# Patient Record
Sex: Female | Born: 1950 | Race: White | Hispanic: No | Marital: Married | Smoking: Former smoker
Health system: Southern US, Community
[De-identification: ages and names within clinical notes are randomized; demographics above are authoritative.]

## PROBLEM LIST (undated history)

## (undated) DIAGNOSIS — D649 Anemia, unspecified: Secondary | ICD-10-CM

## (undated) DIAGNOSIS — I10 Essential (primary) hypertension: Secondary | ICD-10-CM

## (undated) DIAGNOSIS — E119 Type 2 diabetes mellitus without complications: Secondary | ICD-10-CM

## (undated) DIAGNOSIS — E7800 Pure hypercholesterolemia, unspecified: Secondary | ICD-10-CM

## (undated) HISTORY — PX: FRACTURE SURGERY: SHX138

---

## 2001-09-03 ENCOUNTER — Ambulatory Visit (HOSPITAL_COMMUNITY): Admission: RE | Admit: 2001-09-03 | Discharge: 2001-09-03 | Payer: Self-pay | Admitting: Family Medicine

## 2001-09-03 ENCOUNTER — Encounter: Payer: Self-pay | Admitting: Family Medicine

## 2003-09-08 ENCOUNTER — Encounter: Admission: RE | Admit: 2003-09-08 | Discharge: 2003-12-07 | Payer: Self-pay | Admitting: Family Medicine

## 2003-09-14 ENCOUNTER — Other Ambulatory Visit: Admission: RE | Admit: 2003-09-14 | Discharge: 2003-09-14 | Payer: Self-pay | Admitting: Family Medicine

## 2006-05-02 ENCOUNTER — Inpatient Hospital Stay (HOSPITAL_BASED_OUTPATIENT_CLINIC_OR_DEPARTMENT_OTHER): Admission: RE | Admit: 2006-05-02 | Discharge: 2006-05-02 | Payer: Self-pay | Admitting: Interventional Cardiology

## 2006-05-28 ENCOUNTER — Encounter (INDEPENDENT_AMBULATORY_CARE_PROVIDER_SITE_OTHER): Payer: Self-pay | Admitting: Specialist

## 2006-05-28 ENCOUNTER — Ambulatory Visit (HOSPITAL_COMMUNITY): Admission: RE | Admit: 2006-05-28 | Discharge: 2006-05-28 | Payer: Self-pay | Admitting: Gastroenterology

## 2006-06-14 ENCOUNTER — Inpatient Hospital Stay (HOSPITAL_COMMUNITY): Admission: EM | Admit: 2006-06-14 | Discharge: 2006-06-20 | Payer: Self-pay | Admitting: *Deleted

## 2006-09-18 ENCOUNTER — Other Ambulatory Visit: Admission: RE | Admit: 2006-09-18 | Discharge: 2006-09-18 | Payer: Self-pay | Admitting: Family Medicine

## 2008-09-09 ENCOUNTER — Other Ambulatory Visit: Admission: RE | Admit: 2008-09-09 | Discharge: 2008-09-09 | Payer: Self-pay | Admitting: Family Medicine

## 2010-08-12 NOTE — H&P (Signed)
NAME:  Alyssa Stevens, Alyssa Stevens             ACCOUNT NO.:  [id number]   MEDICAL RECORD NO.:  [id number]          PATIENT TYPE:  INP   LOCATION:  1825                         FACILITY:  [facility]   PHYSICIAN:  Lucita Ferrara, MD         DATE OF BIRTH:  1950/10/22   DATE OF ADMISSION:  06/14/2006  DATE OF DISCHARGE:                              HISTORY & PHYSICAL   PRIMARY CARE PHYSICIAN:  Stacie Acres. Cliffton Asters, M.D.   The patient is a 60 year old female who presented to [facility] with a GI bleed.  She was sent by the primary care doctor, Dr.  Laurann Montana, from the office today.  The patient had symptoms of chest  pain, shortness of breath, similar symptoms that she initially had back  in January, at which time Dr. Cliffton Asters sent her for a cardiac evaluation  and Dr. Eldridge Dace did a cardiac catheterization with results showing  negative coronary artery disease.  The patient then showed significant  loss in her hemoglobin from 8.6 on April 08, 2006, to some improvement  with iron in February to 9.2.  The patient currently states that she has  bright red blood per rectum.  Rectal exam in the office showed no  hemorrhoidal bleeding.   The patient had a colonoscopy and endoscopy on May 28, 2006, by Dr.  Ewing Schlein.  The colonoscopy showed internal/externa hemorrhoid which was  thought to cause the bleeding and also sigmoid hyperplastic polyp with a  cold biopsy.  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  The patient was asymptomatic  until 3 days prior, where she again started having shortness of breath  and chest tightness.  Again, she had recurrent blood in the stools.  The  patient states she also has black stools at times, yet she attributed  that to intake of iron.   Otherwise review of systems is negative.  She denies any focal  neurological deficits.   PAST MEDICAL HISTORY:  1. Diabetes, now for 3 years.  2. Hypertension.   MEDICATIONS:  1. Metformin 2,000 mg q.h.s.  2. Lisinopril/hydrochlorothiazide, dose unknown.   ALLERGIES:  NO KNOWN DRUG ALLERGIES.   PAST SURGICAL HISTORY:  As above.   SOCIAL HISTORY:  She lives with her husband.  She denies any smoking or  drugs or alcohol.   PHYSICAL EXAMINATION:  GENERAL:  The patient is in no acute distress.  VITAL SIGNS:  Blood pressure when I saw her was 157/60, heart rate 80,  respirations 16.  HEENT:  Normocephalic atraumatic.  Mucous membranes are moist.  Sclerae  anicteric.  No JVD.  No carotid bruits.  NECK:  Supple.  CARDIOVASCULAR:  S1 S2.  Regular rate and rhythm.  No murmurs, rubs,  clicks.  ABDOMEN:  Soft, nontender, nondistended.  Positive bowel sounds.  RECTAL:  no hemaroid. stool black, quiac positive.  EXTREMITIES:  No cyanosis, clubbing, or edema.  NEUROLOGIC:  Alert and oriented x3.  Cranial nerves II-XII grossly  intact.   LABORATORY DATA:  Basic metabolic panel shows mildly low potassium at  3.3, otherwise normal.  Coags within normal limits.  Hepatic function  within normal limits.  Hemoglobin 5.7, hematocrit 17.6, and MCV of 68.7.   ASSESSMENT/PLAN:  1. This is a 60 year old female with lower gastrointestinal bleeding      most likely secondary to arteriovenous malformation.  Note, I spoke      to Dr. Laural Benes from the gastroenterology service and partner with      Dr. Ewing Schlein, who suggested the patient should be stabilized tonight      with transfusion of packed red blood cells to bring hemoglobin up.      The patient currently hemodynamically stable. If blood pressure      drops we will go ahead and give IV boluses until the blood pressure      is normotensive.  The patient will be admitted to the stepdown unit      and monitored hemodynamically.  In discussion with Dr. Laural Benes, Dr.      Laural Benes suggested that colonoscopy and procedures would be utilized      in order to investigate cause of GI bleeding that may most likely      be an AV  malformation and the malformation will be repaired.  2. Diabetes on Metformin.  We will go ahead and stop Metformin for      tonight and do sliding scale coverage.  3. I have explained plans and procedures of this admission and      hospital coarse to the patient and she understands.      Lucita Ferrara, MD  Electronically Signed     RR/MEDQ  D:  06/14/2006  T:  06/14/2006  Job:  [id number]   cc:   Stacie Acres. Cliffton Asters, M.D.

## 2010-08-12 NOTE — Op Note (Signed)
NAME:  Alyssa Stevens, Alyssa Stevens             ACCOUNT NO.:  [id number]   MEDICAL RECORD NO.:  [id number]          PATIENT TYPE:  INP   LOCATION:  5006                         FACILITY:  [facility]   PHYSICIAN:  Lear Ng, MDDATE OF BIRTH:  1950-11-16   DATE OF PROCEDURE:  DATE OF DISCHARGE:                               OPERATIVE REPORT   PROCEDURE:  Colonoscopy.   INDICATIONS FOR PROCEDURE:  Obscured GI bleeding.   MEDICATIONS:  See preceding EGD report (a total of 125 mcg fentanyl and  12.5 mg IV Versed given for both procedures.   FINDINGS:  Rectal exam was normal.  An adult colonoscope was inserted  into a well-prepped colon and advanced to the cecum where the ileocecal  valve and appendiceal orifice were identified.  In order to reach the  cecum, the patient had to be placed in a supine position and abdominal  pressure had to be applied.  Upon immediately entering the cecum, there  was a small adherent clot attached to one wall of the cecum where an AVM  was noted to be surrounding this clot.  This clot was easily washed  away, and there was small oozing of blood from the middle of this AVM.  No other bleeding source was identified.  The terminal ileum was  intubated and was normal in appearance.  No blood products were seen in  the terminal ileum.  In the cecum, APC was completed at this bleeding  AVM at settings of 240 using the Erbe.  A small amount of oozing  persisted despite argon plasma coagulation.  A total of three hemoclips  were then attached to the cecal wall at the site of the AVM, and a  trickle of blood still remained.  Despite repeated irrigation, this  small oozing of blood persisted.  At this point, 5 mL of 1:10,000  epinephrine was injected in and around the AVM, with complete  hemostasis.  On careful withdrawal of the colonoscope, no other bleeding  source was identified.   ASSESSMENT:  Bleeding cecal arteriovenous malformation, status post  argon plasma  coagulation, three Hemoclips, and epinephrine injection of  a total 5 mL with complete hemostasis.   PLAN:  1. Avoid aspirin if possible  2. Follow serial hemoglobins and hematocrits.  3. If bleeding recurs, then consider repeat colonoscopy versus      embolization.      Lear Ng, MD  Electronically Signed     VCS/MEDQ  D:  06/18/2006  T:  06/18/2006  Job:  [id number]

## 2010-08-12 NOTE — Discharge Summary (Signed)
NAME:  Alyssa Stevens, Alyssa Stevens             ACCOUNT NO.:  [id number]   MEDICAL RECORD NO.:  [id number]          PATIENT TYPE:  INP   LOCATION:  5006                         FACILITY:  [facility]   PHYSICIAN:  Andres Shad. Rudean Curt, MD     DATE OF BIRTH:  [birth year]   DATE OF ADMISSION:  06/14/2006  DATE OF DISCHARGE:  06/20/2006                               DISCHARGE SUMMARY   DISCHARGE DIAGNOSES:  1. Lower gastrointestinal bleed due to arteriovenous malformation.  2. Anemia due to acute gastrointestinal blood loss.   CONDITION ON DISCHARGE:  Good.   SUMMARY OF HOSPITALIZATION:  [name] is a 60 year old female with a  history of diabetes and hypertension who was admitted to the hospital on  March 20 with bright red blood per rectum and a hemoglobin of 5.7.  She  had recently been discharged from the hospital on May 28, 2006 after  having had a lower GI bleed.  She was found to have internal and  external hemorrhoids, a sigmoid polyp, and a questionable cecal AVM on a  colonoscopy performed March 3.  During this admission, the patient was  clinically stable on admission.  She received three units of transfused  blood, and Gastroenterology was consulted.  Several diagnostic  interventions were performed.  These included CT enterography and a  Meckel scan which were negative.  The patient also had a colonoscopy and  upper endoscopy, and these were significant for active bleeding from the  AVM and her cecum.  This was managed with clips and with epinephrine  injections, and the patient remained stable without any further episodes  of bleeding thereafter.   She was discharged to home in stable condition on June 20, 2006.  She  has been instructed to follow up with Gastroenterology as an outpatient.      Andres Shad. Rudean Curt, MD  Electronically Signed     PML/MEDQ  D:  06/20/2006  T:  06/20/2006  Job:  [id number]   cc:   Stacie Acres. Cliffton Asters, M.D.  Graylin Shiver, M.D.

## 2010-08-12 NOTE — Consult Note (Signed)
NAME:  Alyssa Stevens, Alyssa Stevens NO.:  [id number]   MEDICAL RECORD NO.:  [id number]          PATIENT TYPE:  INP   LOCATION:  [id number]                         FACILITY:  [facility]   PHYSICIAN:  Wonda Horner, M.D.   DATE OF BIRTH:  [birth year]   DATE OF CONSULTATION:  06/15/2006  DATE OF DISCHARGE:                                 CONSULTATION   REASON FOR CONSULTATION:  The patient is a 60 year old female who was  admitted to the hospital yesterday with lower GI bleeding.  The patient  was experiencing rectal bleeding associated with loose diarrheal stools  which started several days ago and she dropped her hemoglobin.  She was,  therefore, admitted to the hospital.  The patient has been experiencing  intermittent bouts of loose stools associated with rectal bleeding since  January of this year. She underwent an EGD and a colonoscopy earlier  this month by Dr. Lizbeth Bark without findings of any definite lesions to  explain the rectal bleeding.  The EGD did show a small hiatal hernia and  there was a small area in the esophagus that was biopsied that showed  some fungal elements and she was treated with what sounds Nystatin swish  and swallow. She is not experiencing any dysphagia.  She does not vomit  any blood.  Her colonoscopy showed a few rectal and sigmoid hyperplastic  appearing polyps. There was also mention of a questionable cecal AVM  versus scope trauma, but no evidence of active bleeding.   PAST HISTORY:  Diabetes, hypertension.   MEDICATIONS:  Metformin, lisinopril.   ALLERGIES:  None known.   SOCIAL HISTORY:  She does not smoke or drink alcohol.   REVIEW OF SYSTEMS:  No chest pain or shortness of breath at this time.  She did have some episodes of chest pain recently. She has had a cardiac  clearance in the recent past.   PHYSICAL EXAMINATION:  VITAL SIGNS:  Stable.  GENERAL:  She is in no distress.  HEENT:  Nonicteric.  HEART:  Regular rhythm.  No murmurs.  LUNGS:  Clear.  ABDOMEN:  Soft, nontender, no hepatosplenomegaly   Hemoglobin on admission was 5.7.  She has been transfused 3 units and  hemoglobin is 7.7 now, hematocrit is 23.3.  Protime 13.4.   IMPRESSION:  Lower gastrointestinal bleeding.   COMMENTS:  This has been a recurrent problem since January of this year  with loose stools associated with red rectal bleeding. She essentially  has no pain associated with this problem.  She has had a recent EGD and  colonoscopy by Dr. Watt Climes which were unrevealing for a specific source of  bleeding.  There was the question of a cecal AVM but it could have been  scope trauma, also, per Dr. Perley Jain report.   PLAN:  I am going to look for other possible causes of bleeding.  One  thought that comes to mind could be a Meckel's diverticulum. I will,  therefore, order a Meckel's scan. If this is negative, I would then  consider CT enterography looking for a small bowel lesion.  I would do  these looking for other possibilities of bleeding before repeating  colonoscopy which was just done a little over two weeks ago.           ______________________________  Wonda Horner, M.D.     SFG/MEDQ  D:  06/15/2006  T:  06/15/2006  Job:  [id number]   cc:   Emeline General. Dema Severin, M.D.  Jeryl Columbia, M.D.  Girard Cooter, MD

## 2010-08-12 NOTE — Cardiovascular Report (Signed)
NAME:  Alyssa Stevens, Alyssa Stevens             ACCOUNT NO.:  [id number]   MEDICAL RECORD NO.:  [id number]          PATIENT TYPE:  OIB   LOCATION:  1963                         FACILITY:  [facility]   PHYSICIAN:  Corky Crafts, MDDATE OF BIRTH:  Aug 12, 1950   DATE OF PROCEDURE:  05/02/2006  DATE OF DISCHARGE:                            CARDIAC CATHETERIZATION   REFERRING PHYSICIAN:  Stacie Acres. Cliffton Asters, M.D.   PROCEDURES PERFORMED:  Left heart catheterization, left ventriculogram,  coronary angiogram, abdominal aortogram.   INDICATIONS:  Abnormal stress test and chest pain, diabetes,  hypertension.   PROCEDURE NARRATIVE:  The risks and benefits of cardiac catheterization  were explained to the patient and informed consent was obtained.  The  patient was brought to the catheterization lab and placed on the table.  She was prepped and draped in the usual sterile fashion.  The right  groin was infiltrated with 1% lidocaine.  A 4-French arterial sheath was  placed into her right femoral artery using modified Seldinger technique.  Left coronary artery angiography was performed using a JL-4.0 catheter.  The catheter was advanced to the vessel ostium under fluoroscopic  guidance.  Digital angiography was performed in multiple projections  using hand injection of contrast.  Right coronary artery angiography was  then performed using a 3-D RC catheter.  The catheter was advanced to  the vessel ostium under fluoroscopic guidance.  Digital angiography was  performed in multiple projections using hand injection of contrast.  The  left ventriculogram was then performed using a pigtail catheter.  The  pigtail was advanced to the ascending aorta and across the aortic valve.  Power injection of contrast was done in a 30-degree RAO position.  The  catheter was then pulled back under continuous hemodynamic pressure  monitoring.  The catheter then pulled back to the level of the abdominal  aorta.  A power  injection of contrast was done in the AP projection to  visualize the renal arteries.  The sheath was removed using manual  compression.   FINDINGS:  The left main is short vessel and is widely patent.  There is  a large ramus vessel with mild luminal irregularities.  The LAD is a  large vessel with mild irregularities.  It is large proximally.  The  distal portion in the vessel is small with mild irregularities.  There  is a small D-1 and D-2 which branch off the LAD.  The circumflex is a  large vessel, gives rise to a small OM-1.  There is a large OM-2.  There  are luminal irregularities throughout.  The right coronary artery is a  large, dominant vessel with mild luminal irregularities.  There are no  hemodynamically significant stenoses.  The left ventriculogram showed  normal left ventricular function with no significant mitral  regurgitation.   Hemodynamics show an LV pressure of 123/2 with an LVEDP of 6 mmHg.  Aortic pressure of 123/64 with a mean aortic pressure of 90 mmHg.  The  abdominal aorta shows an abdominal aorta which is normal in size.  There  are single renal arteries supplying each kidney.  There  is mild  atherosclerosis of the right renal artery.  There was no significant  renal artery stenosis to either kidney.   IMPRESSION:  1. There is no hemodynamically significant coronary artery disease.  2. Normal left ventricular function.  3. No renal artery stenosis.  4. Normal hemodynamics.   RECOMMENDATIONS:  The patient should continue aggressive risk factor  modification including blood pressure control, lipid control and  diabetes control.  She is instructed to hold her metformin for 48 hours.  I will see her back in the office in several weeks.      Corky Crafts, MD  Electronically Signed     JSV/MEDQ  D:  05/02/2006  T:  05/02/2006  Job:  [id number]

## 2010-08-12 NOTE — Op Note (Signed)
NAME:  Alyssa Stevens, Alyssa Stevens             ACCOUNT NO.:  [id number]   MEDICAL RECORD NO.:  [id number]          PATIENT TYPE:  AMB   LOCATION:  ENDO                         FACILITY:  [facility]   PHYSICIAN:  Jeryl Columbia, M.D.    DATE OF BIRTH:  10-28-1950   DATE OF PROCEDURE:  05/28/2006  DATE OF DISCHARGE:                               OPERATIVE REPORT   PROCEDURE:  Esophagogastroduodenoscopy with biopsy.   INDICATION:  Iron deficiency anemia, episodic dysphagia, nondiagnostic  colonoscopy.   Consent was signed after risks, benefits, methods, options thoroughly  discussed in the office.  Additional medicines for this procedure, 2 of  Versed only.   PROCEDURE:  The video endoscope was inserted by direct vision.  In the  mid esophagus was a small nodule, although it had a lipomatous look,  when biopsied it felt more hard.  It was smooth and circumferential,  maybe a centimeter.  It was at 30 cm.  Multiple biopsies were obtained.  The scope was inserted into the stomach, possibly she had a tiny hiatal  hernia, no ring or stricture was seen, advanced to the antrum where some  tiny mild erosions were seen, and advanced through a normal pylorus into  a normal duodenal bulb around the celiac to a normal second portion of  the duodenum.  The scope was withdrawn back to the bulb and a good look  there ruled out abnormalities in that location.  The scope was withdrawn  back to the stomach and retroflexed.  Cardia, fundus, angularis, lesser  and greater curve were evaluated on retroflexed and then straight  visualization without additional findings.  I went ahead and took a few  biopsies of the antrum, a few of the proximal stomach to rule out  Helicobacter.  Air was suctioned.  The scope was slowly withdrawn.  Again, the esophagus was normal, except for the nodule mentioned above.  The biopsies were obtained at this time.  No other abnormalities were  seen.  The scope was removed.  The patient  tolerated the procedure well.  There was no obvious immediate complication.   ENDOSCOPIC DIAGNOSES:  1. Questionable tiny hiatal hernia.  2. 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  3. Few early minimal antral erosions, status post biopsies, as well as      proximal stomach biopsy.  4. Otherwise normal esophagogastroduodenoscopy.   PLAN:  Await pathology.  __________ to protect the stomach and followup  in six weeks to recheck symptoms, guaiacs, and CBC, and make sure no  further workup plans are needed.  Possibly a barium swallow or  endoscopic ultrasound.           ______________________________  Jeryl Columbia, M.D.     MEM/MEDQ  D:  05/28/2006  T:  05/28/2006  Job:  [id number]   cc:   Emeline General. Dema Severin, M.D.

## 2010-08-12 NOTE — Op Note (Signed)
NAME:  Alyssa Stevens, Alyssa Stevens             ACCOUNT NO.:  [id number]   MEDICAL RECORD NO.:  [id number]          PATIENT TYPE:  INP   LOCATION:  5006                         FACILITY:  [facility]   PHYSICIAN:  Shirley Friar, MDDATE OF BIRTH:  17-May-1950   DATE OF PROCEDURE:  06/18/2006  DATE OF DISCHARGE:                               OPERATIVE REPORT   INDICATIONS FOR PROCEDURE:  Obscure gastrointestinal bleeding.   MEDICATIONS:  Fentanyl 125 mcg IV, Versed 12.5 mg IV, Cetacaine spray  (this is a total medicine given for upper endoscopy and colonoscopy).   FINDINGS:  Endoscope was certain to the oropharynx and esophagus  intubated.  At 30 cm there was a submucosal nodule that was previously  noted on upper endoscopy 3 weeks ago.  The mucosa was normal in  appearance with no evidence of erythema or ulcers surrounding this  nodule.  Endoscope was advanced down to the stomach which revealed no  blood products.  In the distal portion of the stomach and antrum there  was scattered small erosions without any ulcer seen.  Retroflexion done  which revealed normal angularis, cardia and fundus.  Scope was  straightened and advanced down to the duodenal bulb and second portion  of duodenum which were both normal.  Endoscope was then withdrawn back  into the esophagus and nodule was again revisualized.  Endoscope was  then withdrawn to confirm the above findings.   ASSESSMENT:  1. Small submucosal esophageal nodule (previously biopsied to be      benign).  2. Mild antral erosions.  3. No bleeding source identified.  4. Proceed with colonoscopy.      Shirley Friar, MD  Electronically Signed     VCS/MEDQ  D:  06/18/2006  T:  06/18/2006  Job:  [id number]

## 2010-08-12 NOTE — Op Note (Signed)
NAME:  Alyssa Stevens, Alyssa Stevens             ACCOUNT NO.:  [id number]   MEDICAL RECORD NO.:  [id number]          PATIENT TYPE:  AMB   LOCATION:  ENDO                         FACILITY:  [facility]   PHYSICIAN:  Petra Kuba, M.D.    DATE OF BIRTH:  [birth year]   DATE OF PROCEDURE:  05/28/2006  DATE OF DISCHARGE:                               OPERATIVE REPORT   PROCEDURE:  Colonoscopy with polypectomy and biopsy.   INDICATION:  Bright red blood per rectum, microcytic anemia, due for  colonic screening.  Consent was signed after risks, benefits, methods,  options thoroughly discussed in the office.   MEDICINES USED:  150 mcg of fentanyl, 15 mg of Versed.   DESCRIPTION OF PROCEDURE:  Rectal inspection is pertinent for external  hemorrhoids.  Digital exam was negative.  Video pediatric colonoscope  was inserted and, with lots of difficulty due to a long looping colon  requiring abdominal pressure and rolling on her back and then her right  side, we were able to advance to the cecum.  No signs of bleeding or  significant abnormality were seen on insertion.  The cecum was  identified by the appendiceal orifice and ileocecal valve.  In fact, the  scope was inserted a short ways into the terminal ileum which was  normal.  Photo documentation was obtained.  No blood was seen coming  from above.  The scope was slowly withdrawn.  In the cecum was a  questionable small AVM; it could have been scope trauma; it was not  bleeding.  The scope was then slowly withdrawn.  The ascending,  transverse, and descending were all normal.  In the distal sigmoid, a  few tiny to small polyps were seen, two were snared, electrocautery  applied, the polyps were removed, suctioned through the scope, and  collected in the trap.  Then a few other tiny, hyperplastic-appearing  rectal distal sigmoid polyps were seen and were cold biopsied and put in  the same container.  Anorectal pull-through and retroflexion confirmed  the  small hemorrhoids.  The scope was straightened, re-advanced through  the left side of the colon.  Air was suctioned, the scope removed.  The  patient tolerated the procedure adequately.  There were no obvious  immediate complications.   ENDOSCOPIC DIAGNOSES:  1. Internal/external hemorrhoids cause of bleeding.  2. Multiple rectal distal sigmoid hyperplastic-appearing polyps, cold      biopsied.  3. Two distal sigmoid small polyp, snared.  4. Questionable cecal arteriovenous malformation versus scope trauma,      not actively bleeding.  5. Otherwise within normal limits to the terminal ileum without any      heme being seen.   PLAN:  Will await pathology to determine future colonic screening,  probably Diprivan and a regular scope in the future and continue workup  with an EGD.  She seems to be doing fine ties colonoscopy.          ______________________________  Petra Kuba, M.D.    MEM/MEDQ  D:  05/28/2006  T:  05/28/2006  Job:  [id number]   cc:   Aram Beecham  Lucilla Lame, M.D.

## 2012-01-03 ENCOUNTER — Other Ambulatory Visit (HOSPITAL_COMMUNITY)
Admission: RE | Admit: 2012-01-03 | Discharge: 2012-01-03 | Disposition: A | Discharge status: Home / Self Care | Payer: 59 | Source: Ambulatory Visit | Attending: Family Medicine | Admitting: Family Medicine

## 2012-01-03 ENCOUNTER — Other Ambulatory Visit: Payer: Self-pay | Admitting: Family Medicine

## 2012-01-03 DIAGNOSIS — Z0000 Encounter for general adult medical examination without abnormal findings: Secondary | ICD-10-CM | POA: Insufficient documentation

## 2015-02-23 ENCOUNTER — Other Ambulatory Visit: Payer: Self-pay | Admitting: Gastroenterology

## 2015-06-04 ENCOUNTER — Other Ambulatory Visit: Payer: Self-pay | Admitting: Family Medicine

## 2015-06-04 ENCOUNTER — Other Ambulatory Visit (HOSPITAL_COMMUNITY)
Admission: RE | Admit: 2015-06-04 | Discharge: 2015-06-04 | Disposition: A | Discharge status: Home / Self Care | Payer: Self-pay | Source: Ambulatory Visit | Attending: Family Medicine | Admitting: Family Medicine

## 2015-06-04 DIAGNOSIS — Z124 Encounter for screening for malignant neoplasm of cervix: Secondary | ICD-10-CM | POA: Insufficient documentation

## 2015-06-07 LAB — CYTOLOGY - PAP

## 2015-07-02 ENCOUNTER — Other Ambulatory Visit: Payer: Self-pay | Admitting: Gastroenterology

## 2015-07-02 DIAGNOSIS — D509 Iron deficiency anemia, unspecified: Secondary | ICD-10-CM

## 2015-07-22 ENCOUNTER — Ambulatory Visit
Admission: RE | Admit: 2015-07-22 | Discharge: 2015-07-22 | Disposition: A | Discharge status: Home / Self Care | Payer: 59 | Source: Ambulatory Visit | Attending: Gastroenterology | Admitting: Gastroenterology

## 2015-07-22 DIAGNOSIS — D509 Iron deficiency anemia, unspecified: Secondary | ICD-10-CM

## 2015-07-22 MED ORDER — IOPAMIDOL (ISOVUE-300) INJECTION 61%
125.0000 mL | Freq: Once | INTRAVENOUS | Status: AC | PRN
  Administered 2015-07-22: 125 mL via INTRAVENOUS

## 2015-09-13 ENCOUNTER — Other Ambulatory Visit: Payer: Self-pay | Admitting: Gastroenterology

## 2015-09-13 ENCOUNTER — Encounter (HOSPITAL_COMMUNITY): Payer: Self-pay | Admitting: *Deleted

## 2015-09-13 NOTE — Progress Notes (Signed)
Pt denies SOB, chest pain, and being under the care of a cardiologist. Pt denies having a cardiac cath end echo but stated that a stress test was done > 20 years ago. Pt denies having an EKG and chest x ray within the last year. Pt stated that labs were drawn at Dr. Perley Jain office on 09/03/15. Pt made aware to hold diabetes pills on DOS and to stop taking Aspirin, vitamins, fish oil and herbal medications. Do not take any NSAIDs ie: Ibuprofen, Advil, Naproxen, BC and Goody Powder or any medication containing Aspirin. Pt verbalized understanding of all pre-op instructions.

## 2015-09-15 ENCOUNTER — Encounter (HOSPITAL_COMMUNITY): Payer: Self-pay | Admitting: *Deleted

## 2015-09-15 ENCOUNTER — Ambulatory Visit (HOSPITAL_COMMUNITY)
Admission: RE | Admit: 2015-09-15 | Discharge: 2015-09-15 | Disposition: A | Discharge status: Home / Self Care | Payer: 59 | Source: Ambulatory Visit | Attending: Gastroenterology | Admitting: Gastroenterology

## 2015-09-15 ENCOUNTER — Encounter (HOSPITAL_COMMUNITY)
Admission: RE | Disposition: A | Discharge status: Home / Self Care | Payer: Self-pay | Source: Ambulatory Visit | Attending: Gastroenterology

## 2015-09-15 ENCOUNTER — Ambulatory Visit (HOSPITAL_COMMUNITY): Payer: 59 | Admitting: Anesthesiology

## 2015-09-15 DIAGNOSIS — Z87891 Personal history of nicotine dependence: Secondary | ICD-10-CM | POA: Insufficient documentation

## 2015-09-15 DIAGNOSIS — K644 Residual hemorrhoidal skin tags: Secondary | ICD-10-CM | POA: Insufficient documentation

## 2015-09-15 DIAGNOSIS — K219 Gastro-esophageal reflux disease without esophagitis: Secondary | ICD-10-CM | POA: Diagnosis not present

## 2015-09-15 DIAGNOSIS — Z7984 Long term (current) use of oral hypoglycemic drugs: Secondary | ICD-10-CM | POA: Insufficient documentation

## 2015-09-15 DIAGNOSIS — I10 Essential (primary) hypertension: Secondary | ICD-10-CM | POA: Insufficient documentation

## 2015-09-15 DIAGNOSIS — E119 Type 2 diabetes mellitus without complications: Secondary | ICD-10-CM | POA: Diagnosis not present

## 2015-09-15 DIAGNOSIS — D500 Iron deficiency anemia secondary to blood loss (chronic): Secondary | ICD-10-CM | POA: Insufficient documentation

## 2015-09-15 DIAGNOSIS — K648 Other hemorrhoids: Secondary | ICD-10-CM | POA: Insufficient documentation

## 2015-09-15 DIAGNOSIS — Z79899 Other long term (current) drug therapy: Secondary | ICD-10-CM | POA: Insufficient documentation

## 2015-09-15 DIAGNOSIS — K5520 Angiodysplasia of colon without hemorrhage: Secondary | ICD-10-CM | POA: Insufficient documentation

## 2015-09-15 DIAGNOSIS — K5730 Diverticulosis of large intestine without perforation or abscess without bleeding: Secondary | ICD-10-CM | POA: Diagnosis not present

## 2015-09-15 HISTORY — PX: HOT HEMOSTASIS: SHX5433

## 2015-09-15 HISTORY — DX: Pure hypercholesterolemia, unspecified: E78.00

## 2015-09-15 HISTORY — PX: COLONOSCOPY WITH PROPOFOL: SHX5780

## 2015-09-15 HISTORY — DX: Type 2 diabetes mellitus without complications: E11.9

## 2015-09-15 HISTORY — DX: Anemia, unspecified: D64.9

## 2015-09-15 HISTORY — DX: Essential (primary) hypertension: I10

## 2015-09-15 LAB — GLUCOSE, CAPILLARY: Glucose-Capillary: 197 mg/dL — ABNORMAL HIGH (ref 65–99)

## 2015-09-15 SURGERY — COLONOSCOPY WITH PROPOFOL
Anesthesia: Monitor Anesthesia Care

## 2015-09-15 MED ORDER — SODIUM CHLORIDE 0.9 % IV SOLN: INTRAVENOUS | Status: DC

## 2015-09-15 MED ORDER — PROPOFOL 10 MG/ML IV BOLUS
INTRAVENOUS | Status: DC | PRN
  Administered 2015-09-15: 20 mg via INTRAVENOUS
  Administered 2015-09-15: 50 mg via INTRAVENOUS
  Administered 2015-09-15: 30 mg via INTRAVENOUS

## 2015-09-15 MED ORDER — LACTATED RINGERS IV SOLN
INTRAVENOUS | Status: DC
  Administered 2015-09-15: 1000 mL via INTRAVENOUS

## 2015-09-15 MED ORDER — PROPOFOL 500 MG/50ML IV EMUL
INTRAVENOUS | Status: DC | PRN
  Administered 2015-09-15: 100 ug/kg/min via INTRAVENOUS

## 2015-09-15 NOTE — Op Note (Addendum)
[facility] Patient Name: Alyssa Stevens Procedure Date : 09/15/2015 MRN: [id number] Attending MD: Clarene Essex , MD Date of Birth: [birth year] CSN: [id number] Age: 65 Admit Type: Outpatient Procedure:                Colonoscopy Indications:              Heme positive stool, Iron deficiency anemia                            secondary to chronic blood loss Providers:                Clarene Essex, MD, Cleda Daub, RN, Corliss Parish,                            Technician, Izora Gala, CRNA Referring MD:              Medicines:                Propofol total dose [id number] mg IV Complications:            No immediate complications. Estimated Blood Loss:     Estimated blood loss: none. Procedure:                Pre-Anesthesia Assessment:                           - Prior to the procedure, a History and Physical                            was performed, and patient medications and                            allergies were reviewed. The patient's tolerance of                            previous anesthesia was also reviewed. The risks                            and benefits of the procedure and the sedation                            options and risks were discussed with the patient.                            All questions were answered, and informed consent                            was obtained. Prior Anticoagulants: The patient has                            taken no previous anticoagulant or antiplatelet                            agents. ASA Grade Assessment: II - A patient with  mild systemic disease. After reviewing the risks                            and benefits, the patient was deemed in                            satisfactory condition to undergo the procedure.                           After obtaining informed consent, the colonoscope                            was passed under direct vision. Throughout the   procedure, the patient's blood pressure, pulse, and                            oxygen saturations were monitored continuously. The                            [id number] scope was introduced through                            the anus and advanced to the the terminal ileum.                            The terminal ileum, ileocecal valve, appendiceal                            orifice, and rectum were photographed. The                            colonoscopy was somewhat difficult due to                            significant looping and a tortuous colon.                            Successful completion of the procedure was aided by                            applying abdominal pressure. The patient tolerated                            the procedure well. The quality of the bowel                            preparation was adequate. Scope In: 12:08:04 PM Scope Out: 12:29:34 PM Scope Withdrawal Time: 0 hours 11 minutes 55 seconds  Total Procedure Duration: 0 hours 21 minutes 30 seconds  Findings:      External and internal hemorrhoids were found during retroflexion, during       perianal exam and during digital exam. The hemorrhoids were small.      A few small-mouthed diverticula were found in the sigmoid colon and       distal descending colon.  A single medium-sized angiodysplastic lesion without bleeding was found       in the cecum. Coagulation for tissue destruction using argon plasma at       0.5 liters/minute and 20 watts was successful.      The terminal ileum appeared normal.      The exam was otherwise without abnormality. Impression:               - External and internal hemorrhoids.                           - Diverticulosis in the sigmoid colon and in the                            distal descending colon.                           - A single non-bleeding colonic angiodysplastic                            lesion. Treated with argon plasma coagulation (APC).                            - The examined portion of the ileum was normal.                           - The examination was otherwise normal.                           - No specimens collected. Moderate Sedation:      moderate sedation-none Recommendation:           - Patient has a contact number available for                            emergencies. The signs and symptoms of potential                            delayed complications were discussed with the                            patient. Return to normal activities tomorrow.                            Written discharge instructions were provided to the                            patient.                           - Soft diet today.                           - Continue present medications.                           - Repeat colonoscopy PRN for screening purposes.                           -  Return to GI clinic in 6 weeks.                           - Telephone GI clinic if symptomatic PRN. Procedure Code(s):        --- Professional ---                           [phone], Colonoscopy, flexible; with ablation of                            tumor(s), polyp(s), or other lesion(s) (includes                            pre- and post-dilation and guide wire passage, when                            performed) Diagnosis Code(s):        --- Professional ---                           K64.8, Other hemorrhoids                           K55.20, Angiodysplasia of colon without hemorrhage                           R19.5, Other fecal abnormalities                           D50.0, Iron deficiency anemia secondary to blood                            loss (chronic)                           K57.30, Diverticulosis of large intestine without                            perforation or abscess without bleeding CPT copyright 2016 American Medical Association. All rights reserved. The codes documented in this report are preliminary and upon coder review may  be revised to  meet current compliance requirements. Clarene Essex, MD 09/15/2015 12:35:59 PM This report has been signed electronically. Number of Addenda: 0

## 2015-09-15 NOTE — Discharge Instructions (Addendum)
Call if question or problem otherwise follow-up in 6 weeks and hold aspirin for 2 weeks and then consider only taking every other day and okay to start iron once a dayColonoscopy, Care After These instructions give you information on caring for yourself after your procedure. Your doctor may also give you more specific instructions. Call your doctor if you have any problems or questions after your procedure. HOME CARE  Do not drive for 24 hours.  Do not sign important papers or use machinery for 24 hours.  You may shower.  You may go back to your usual activities, but go slower for the first 24 hours.  Take rest breaks often during the first 24 hours.  Walk around or use warm packs on your belly (abdomen) if you have belly cramping or gas.  Drink enough fluids to keep your pee (urine) clear or pale yellow.  Resume your normal diet. Avoid heavy or fried foods.  Avoid drinking alcohol for 24 hours or as told by your doctor.  Only take medicines as told by your doctor. If a tissue sample (biopsy) was taken during the procedure:   Do not take aspirin or blood thinners for 7 days, or as told by your doctor.  Do not drink alcohol for 7 days, or as told by your doctor.  Eat soft foods for the first 24 hours. GET HELP IF: You still have a small amount of blood in your poop (stool) 2-3 days after the procedure. GET HELP RIGHT AWAY IF:  You have more than a small amount of blood in your poop.  You see clumps of tissue (blood clots) in your poop.  Your belly is puffy (swollen).  You feel sick to your stomach (nauseous) or throw up (vomit).  You have a fever.  You have belly pain that gets worse and medicine does not help. MAKE SURE YOU:  Understand these instructions.  Will watch your condition.  Will get help right away if you are not doing well or get worse.   This information is not intended to replace advice given to you by your health care provider. Make sure you discuss  any questions you have with your health care provider.   Document Released: 04/15/2010 Document Revised: 03/18/2013 Document Reviewed: 11/18/2012 Elsevier Interactive Patient Education [employer].

## 2015-09-15 NOTE — Progress Notes (Signed)
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

## 2015-09-15 NOTE — Anesthesia Preprocedure Evaluation (Addendum)
Anesthesia Evaluation  Patient identified by MRN, date of birth, ID band Patient awake    Reviewed: Allergy & Precautions, NPO status , Patient's Chart, lab work & pertinent test results  Airway Mallampati: II  TM Distance: >3 FB Neck ROM: Full    Dental  (+) Teeth Intact, Dental Advisory Given, Caps,    Pulmonary former smoker,    Pulmonary exam normal breath sounds clear to auscultation       Cardiovascular hypertension, Pt. on medications Normal cardiovascular exam Rhythm:Regular Rate:Normal     Neuro/Psych negative neurological ROS  negative psych ROS   GI/Hepatic Neg liver ROS, GERD  Medicated and Controlled,  Endo/Other  diabetes, Type 2, Oral Hypoglycemic AgentsObesity   Renal/GU negative Renal ROS     Musculoskeletal negative musculoskeletal ROS (+)   Abdominal   Peds  Hematology  (+) Blood dyscrasia, anemia ,   Anesthesia Other Findings Day of surgery medications reviewed with the patient.  Reproductive/Obstetrics negative OB ROS                            Anesthesia Physical Anesthesia Plan  ASA: III  Anesthesia Plan: MAC   Post-op Pain Management:    Induction: Intravenous  Airway Management Planned: Nasal Cannula  Additional Equipment:   Intra-op Plan:   Post-operative Plan:   Informed Consent: I have reviewed the patients History and Physical, chart, labs and discussed the procedure including the risks, benefits and alternatives for the proposed anesthesia with the patient or authorized representative who has indicated his/her understanding and acceptance.   Dental advisory given  Plan Discussed with: CRNA and Anesthesiologist  Anesthesia Plan Comments: (Discussed risks/benefits/alternatives to MAC sedation including need for ventilatory support, hypotension, need for conversion to general anesthesia.  All patient questions answered.  Patient/guardian wishes to  proceed.)        Anesthesia Quick Evaluation

## 2015-09-15 NOTE — Transfer of Care (Signed)
Immediate Anesthesia Transfer of Care Note  Patient: Alyssa Stevens  Procedure(s) Performed: Procedure(s): COLONOSCOPY WITH PROPOFOL (N/A) HOT HEMOSTASIS (ARGON PLASMA COAGULATION/BICAP) (N/A)  Patient Location: PACU  Anesthesia Type:MAC  Level of Consciousness: awake, alert  and oriented  Airway & Oxygen Therapy: Patient Spontanous Breathing and Patient connected to nasal cannula oxygen  Post-op Assessment: Report given to RN and Post -op Vital signs reviewed and stable  Post vital signs: Reviewed and stable  Last Vitals:  Filed Vitals:   09/15/15 1055 09/15/15 1240  BP: 172/65   Pulse: 89   Temp: 37.2 C 36.9 C  Resp: 17     Last Pain: There were no vitals filed for this visit.       Complications: No apparent anesthesia complications

## 2015-09-15 NOTE — Anesthesia Postprocedure Evaluation (Signed)
Anesthesia Post Note  Patient: Alyssa Stevens  Procedure(s) Performed: Procedure(s) (LRB): COLONOSCOPY WITH PROPOFOL (N/A) HOT HEMOSTASIS (ARGON PLASMA COAGULATION/BICAP) (N/A)  Patient location during evaluation: Endoscopy Anesthesia Type: MAC Level of consciousness: awake and alert Pain management: pain level controlled Vital Signs Assessment: post-procedure vital signs reviewed and stable Respiratory status: spontaneous breathing, nonlabored ventilation, respiratory function stable and patient connected to nasal cannula oxygen Cardiovascular status: stable and blood pressure returned to baseline Anesthetic complications: no    Last Vitals:  Filed Vitals:   09/15/15 1055 09/15/15 1240  BP: 172/65   Pulse: 89   Temp: 37.2 C 36.9 C  Resp: 17     Last Pain: There were no vitals filed for this visit.               [name]

## 2015-09-16 ENCOUNTER — Encounter (HOSPITAL_COMMUNITY): Payer: Self-pay | Admitting: Gastroenterology

## 2015-10-02 ENCOUNTER — Emergency Department (HOSPITAL_COMMUNITY): Payer: BLUE CROSS/BLUE SHIELD

## 2015-10-02 ENCOUNTER — Emergency Department (HOSPITAL_COMMUNITY)
Admission: EM | Admit: 2015-10-02 | Discharge: 2015-10-02 | Disposition: A | Discharge status: Home / Self Care | Payer: BLUE CROSS/BLUE SHIELD | Attending: Emergency Medicine | Admitting: Emergency Medicine

## 2015-10-02 ENCOUNTER — Encounter (HOSPITAL_COMMUNITY): Payer: Self-pay | Admitting: Emergency Medicine

## 2015-10-02 DIAGNOSIS — R1010 Upper abdominal pain, unspecified: Secondary | ICD-10-CM | POA: Diagnosis present

## 2015-10-02 DIAGNOSIS — Z7984 Long term (current) use of oral hypoglycemic drugs: Secondary | ICD-10-CM | POA: Insufficient documentation

## 2015-10-02 DIAGNOSIS — Z87891 Personal history of nicotine dependence: Secondary | ICD-10-CM | POA: Diagnosis not present

## 2015-10-02 DIAGNOSIS — E119 Type 2 diabetes mellitus without complications: Secondary | ICD-10-CM | POA: Diagnosis not present

## 2015-10-02 DIAGNOSIS — Z79899 Other long term (current) drug therapy: Secondary | ICD-10-CM | POA: Insufficient documentation

## 2015-10-02 DIAGNOSIS — N390 Urinary tract infection, site not specified: Secondary | ICD-10-CM | POA: Diagnosis not present

## 2015-10-02 DIAGNOSIS — K8020 Calculus of gallbladder without cholecystitis without obstruction: Secondary | ICD-10-CM | POA: Insufficient documentation

## 2015-10-02 DIAGNOSIS — I10 Essential (primary) hypertension: Secondary | ICD-10-CM | POA: Insufficient documentation

## 2015-10-02 LAB — CBC WITH DIFFERENTIAL/PLATELET
Basophils Absolute: 0 10*3/uL (ref 0.0–0.1)
Basophils Relative: 0 %
Eosinophils Absolute: 0.1 10*3/uL (ref 0.0–0.7)
Eosinophils Relative: 1 %
HCT: 35 % — ABNORMAL LOW (ref 36.0–46.0)
Hemoglobin: 11 g/dL — ABNORMAL LOW (ref 12.0–15.0)
Lymphocytes Relative: 21 %
Lymphs Abs: 2 10*3/uL (ref 0.7–4.0)
MCH: 23.1 pg — ABNORMAL LOW (ref 26.0–34.0)
MCHC: 31.4 g/dL (ref 30.0–36.0)
MCV: 73.5 fL — ABNORMAL LOW (ref 78.0–100.0)
Monocytes Absolute: 0.5 10*3/uL (ref 0.1–1.0)
Monocytes Relative: 5 %
Neutro Abs: 7.1 10*3/uL (ref 1.7–7.7)
Neutrophils Relative %: 73 %
Platelets: 488 10*3/uL — ABNORMAL HIGH (ref 150–400)
RBC: 4.76 MIL/uL (ref 3.87–5.11)
RDW: 16.5 % — ABNORMAL HIGH (ref 11.5–15.5)
WBC: 9.7 10*3/uL (ref 4.0–10.5)

## 2015-10-02 LAB — URINALYSIS, ROUTINE W REFLEX MICROSCOPIC
Glucose, UA: NEGATIVE mg/dL
Hgb urine dipstick: NEGATIVE
Ketones, ur: NEGATIVE mg/dL
Nitrite: POSITIVE — AB
Protein, ur: 30 mg/dL — AB
Specific Gravity, Urine: 1.03 (ref 1.005–1.030)
pH: 5 (ref 5.0–8.0)

## 2015-10-02 LAB — COMPREHENSIVE METABOLIC PANEL
ALT: 29 U/L (ref 14–54)
AST: 31 U/L (ref 15–41)
Albumin: 4.4 g/dL (ref 3.5–5.0)
Alkaline Phosphatase: 126 U/L (ref 38–126)
Anion gap: 12 (ref 5–15)
BUN: 12 mg/dL (ref 6–20)
CO2: 25 mmol/L (ref 22–32)
Calcium: 9.7 mg/dL (ref 8.9–10.3)
Chloride: 99 mmol/L — ABNORMAL LOW (ref 101–111)
Creatinine, Ser: 0.84 mg/dL (ref 0.44–1.00)
GFR calc Af Amer: >60 mL/min (ref >60)
GFR calc non Af Amer: >60 mL/min (ref >60)
Glucose, Bld: 194 mg/dL — ABNORMAL HIGH (ref 65–99)
Potassium: 3.5 mmol/L (ref 3.5–5.1)
Sodium: 136 mmol/L (ref 135–145)
Total Bilirubin: 0.6 mg/dL (ref 0.3–1.2)
Total Protein: 7.8 g/dL (ref 6.5–8.1)

## 2015-10-02 LAB — URINE MICROSCOPIC-ADD ON: RBC / HPF: NONE SEEN RBC/hpf (ref 0–5)

## 2015-10-02 LAB — LIPASE, BLOOD: Lipase: 31 U/L (ref 11–51)

## 2015-10-02 MED ORDER — ONDANSETRON HCL 4 MG/2ML IJ SOLN
4.0000 mg | Freq: Once | INTRAMUSCULAR | Status: AC
  Administered 2015-10-02: 4 mg via INTRAVENOUS
  Filled 2015-10-02: qty 2

## 2015-10-02 MED ORDER — ONDANSETRON 4 MG PO TBDP: 4.0000 mg | ORAL_TABLET | Freq: Three times a day (TID) | ORAL | Status: DC | PRN

## 2015-10-02 MED ORDER — CEPHALEXIN 500 MG PO CAPS: 500.0000 mg | ORAL_CAPSULE | Freq: Two times a day (BID) | ORAL | Status: DC

## 2015-10-02 MED ORDER — HYDROCODONE-ACETAMINOPHEN 5-325 MG PO TABS: 1.0000 | ORAL_TABLET | Freq: Four times a day (QID) | ORAL | Status: DC | PRN

## 2015-10-02 MED ORDER — SODIUM CHLORIDE 0.9 % IV BOLUS (SEPSIS)
1000.0000 mL | Freq: Once | INTRAVENOUS | Status: AC
  Administered 2015-10-02: 1000 mL via INTRAVENOUS

## 2015-10-02 MED ORDER — IOPAMIDOL (ISOVUE-300) INJECTION 61%
100.0000 mL | Freq: Once | INTRAVENOUS | Status: AC | PRN
  Administered 2015-10-02: 100 mL via INTRAVENOUS

## 2015-10-02 MED ORDER — SODIUM CHLORIDE 0.9 % IV SOLN
INTRAVENOUS | Status: DC
  Administered 2015-10-02: 10:00:00 via INTRAVENOUS

## 2015-10-02 MED ORDER — DEXTROSE 5 % IV SOLN
1.0000 g | Freq: Once | INTRAVENOUS | Status: AC
  Administered 2015-10-02: 1 g via INTRAVENOUS
  Filled 2015-10-02: qty 10

## 2015-10-02 NOTE — ED Notes (Signed)
Pt c/o mid upper abdominal pain and feels like gas pain all over since Tuesday. Pt states she has had diarrhea and vomiting. Pt saw her PCP and was sent for further evaluation. Pt states the last time she had her hemoglobin checked it was low. Little bowel movement this am and 1 episode of emesis today. Pt states she has not been able to eat or drink so she is mostly dry heaving.

## 2015-10-02 NOTE — ED Notes (Signed)
RN getting labs with IV

## 2015-10-02 NOTE — Discharge Instructions (Signed)
As discussed, your evaluation today has been largely reassuring.  But, it is important that you monitor your condition carefully, and do not hesitate to return to the ED if you develop new, or concerning changes in your condition.  With the identified presence of gallstones, as well as a urinary tract infection it is important that you follow-up with your physicians.

## 2015-10-02 NOTE — ED Provider Notes (Signed)
CSN: [id number]     Arrival date & time 10/02/15  0707 History   First MD Initiated Contact with Patient 10/02/15 [phone]     Chief Complaint  Patient presents with  . Abdominal Pain     (Consider location/radiation/quality/duration/timing/severity/associated sxs/prior Treatment) HPI  Patient presents with concern of abdominal pain, nausea, vomiting, diarrhea. Symptoms began about 4 days ago. Initially patient had upper abdominal pain, nausea.  Subsequently the patient had several episodes of diarrhea, and then vomiting. Since onset patient has had episodic upper abdominal pain, sharp, severe, nonradiating. Pain is focally in the epigastrium, with no flank pain. There is associated anorexia. Pain seems worse at night. Patient has seen her physician, had blood work performed, and is scheduled for general surgery consultation in one week. She has no history of abdominal surgery, but does state that she has history of gallstones.  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  Past Medical History  Diagnosis Date  . Diabetes mellitus without complication ([place])   . Hypertension   . Hypercholesterolemia   . Anemia   . Anemia    Past Surgical History  Procedure Laterality Date  . Fracture surgery      left leg  . Colonoscopy with propofol N/A 09/15/2015    Procedure: COLONOSCOPY WITH PROPOFOL;  Surgeon: Clarene Essex, MD;  Location: [facility] ENDOSCOPY;  Service: Endoscopy;  Laterality: N/A;  . Hot hemostasis N/A 09/15/2015    Procedure: HOT HEMOSTASIS (ARGON PLASMA COAGULATION/BICAP);  Surgeon: Clarene Essex, MD;  Location[facility] ENDOSCOPY;  Service: Endoscopy;  Laterality: N/A;   Family History  Problem Relation Age of Onset  . Diabetes Mother   . Cancer Mother   . Heart attack Father   . Diabetes Sister    Social History  Substance Use Topics  . Smoking status: Former Smoker    Types: Cigarettes  . Smokeless  tobacco: Never Used     Comment:  " Quit smoking cigarettes in 2001"  . Alcohol Use: Yes     Comment: social   OB History    No data available     Review of Systems  Constitutional:       Per HPI, otherwise negative  HENT:       Per HPI, otherwise negative  Respiratory:       Per HPI, otherwise negative  Cardiovascular:       Per HPI, otherwise negative  Gastrointestinal: Positive for nausea, vomiting and abdominal pain.  Endocrine:       Negative aside from HPI  Genitourinary:       Neg aside from HPI   Musculoskeletal:       Per HPI, otherwise negative  Skin: Negative.   Neurological: Negative for syncope.      Allergies  Trulicity and Jardiance  Home Medications   Prior to Admission medications   Medication Sig Start Date End Date Taking? Authorizing Provider  Calcium Carb-Cholecalciferol (CALCIUM 600+D3 PO) Take 1 tablet by mouth daily.   Yes Historical Provider, MD  Cholecalciferol (VITAMIN D) 2000 units CAPS Take 1 capsule by mouth 2 (two) times daily.   Yes Historical Provider, MD  glimepiride (AMARYL) 4 MG tablet Take 4 mg by mouth daily with breakfast.   Yes Historical Provider, MD  iron polysaccharides (FERREX 150) 150 MG capsule Take 150 mg by mouth daily.   Yes Historical Provider, MD  lisinopril-hydrochlorothiazide (PRINZIDE,ZESTORETIC) 20-25 MG tablet Take 1 tablet  by mouth daily.   Yes Historical Provider, MD  metFORMIN (GLUMETZA) 500 MG (MOD) 24 hr tablet Take 2,000 mg by mouth at bedtime.   Yes Historical Provider, MD  naproxen sodium (ANAPROX) 220 MG tablet Take 220 mg by mouth every 12 (twelve) hours as needed (for pain).   Yes Historical Provider, MD  ondansetron (ZOFRAN) 8 MG tablet Take 8 mg by mouth every 8 (eight) hours as needed for nausea or vomiting.   Yes Historical Provider, MD  pantoprazole (PROTONIX) 40 MG tablet Take 40 mg by mouth daily.   Yes Historical Provider, MD  pioglitazone (ACTOS) 15 MG tablet Take 15 mg by mouth daily.   Yes  Historical Provider, MD  pravastatin (PRAVACHOL) 20 MG tablet Take 20 mg by mouth daily.   Yes Historical Provider, MD   BP 154/70 mmHg  Pulse 94  Temp(Src) 98.7 F (37.1 C) (Oral)  Resp 18  Ht 5\' 4"  (1.626 m)  Wt 225 lb (102.059 kg)  BMI 38.60 kg/m2  SpO2 94% Physical Exam  Constitutional: She is oriented to person, place, and time. She appears well-developed and well-nourished. No distress.  HENT:  Head: Normocephalic and atraumatic.  Eyes: Conjunctivae and EOM are normal.  Cardiovascular: Normal rate and regular rhythm.   Pulmonary/Chest: Effort normal and breath sounds normal. No stridor. No respiratory distress.  Abdominal: She exhibits no distension.  Tenderness about the epigastrium, negative Murphy sign, no flank pain  Musculoskeletal: She exhibits no edema.  Neurological: She is alert and oriented to person, place, and time. No cranial nerve deficit.  Skin: Skin is warm and dry.  Psychiatric: She has a normal mood and affect.  Nursing note and vitals reviewed.   ED Course  Procedures (including critical care time) Labs Review Labs Reviewed  COMPREHENSIVE METABOLIC PANEL - Abnormal; Notable for the following:    Chloride 99 (*)    Glucose, Bld 194 (*)    All other components within normal limits  CBC WITH DIFFERENTIAL/PLATELET - Abnormal; Notable for the following:    Hemoglobin 11.0 (*)    HCT 35.0 (*)    MCV 73.5 (*)    MCH 23.1 (*)    RDW 16.5 (*)    Platelets 488 (*)    All other components within normal limits  URINALYSIS, ROUTINE W REFLEX MICROSCOPIC (NOT AT [facility]) - Abnormal; Notable for the following:    Color, Urine AMBER (*)    APPearance TURBID (*)    Bilirubin Urine SMALL (*)    Protein, ur 30 (*)    Nitrite POSITIVE (*)    Leukocytes, UA MODERATE (*)    All other components within normal limits  URINE MICROSCOPIC-ADD ON - Abnormal; Notable for the following:    Squamous Epithelial / LPF 0-5 (*)    Bacteria, UA MANY (*)    Casts HYALINE CASTS  (*)    Crystals CA OXALATE CRYSTALS (*)    All other components within normal limits  LIPASE, BLOOD    Imaging Review Ct Abdomen Pelvis W Contrast  10/02/2015  CLINICAL DATA:  Mid abdominal pain EXAM: CT ABDOMEN AND PELVIS WITH CONTRAST TECHNIQUE: Multidetector CT imaging of the abdomen and pelvis was performed using the standard protocol following bolus administration of intravenous contrast. CONTRAST:  17mL ISOVUE-300 IOPAMIDOL (ISOVUE-300) INJECTION 61% COMPARISON:  07/22/2015 FINDINGS: Diffuse hepatic steatosis. Cholelithiasis. Spleen, pancreas, adrenal glands are within normal limits. Benign cysts in the left kidney.  Right kidney is unremarkable. Normal appendix. Bladder, uterus, and adnexa are unremarkable.  Sigmoid colon is within normal limits. No evidence of small-bowel obstruction. No obvious mass in the colon. Atherosclerotic calcifications of the aorta. No free-fluid.  No abnormal adenopathy by measurement criteria. No vertebral compression deformity. 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 IMPRESSION: Cholelithiasis. Electronically Signed   By: Marybelle Killings M.D.   On: 10/02/2015 10:07   I have personally reviewed and evaluated these images and lab results as part of my medical decision-making.  10:26 AM Patient ambulatory, states that she feels better. She is aware of all findings. Patient will receive antibiotics, continue monitoring.  Update: Patient tolerated antibiotics well, will follow-up with general surgery as scheduled this week, primary care as well.   MDM  Patient presents with concern of ongoing abdominal pain. Here the patient is awake and alert, has a soft, non-peritoneal abdomen, though with tenderness in the epigastrium. Patient has no right upper quadrant tenderness to palpation, no abnormal liver function test, no fever, and though there is evidence for gallstones on CT scan, no evidence for acute cholecystitis. Patient does have evidence for ongoing  urinary tract infection in addition to gallstones. Patient improved here with fluids, symptomatic management, tolerated initial antibiotic well. Patient appropriate for discharge, follow-up with primary care and general surgery.   Carmin Muskrat, MD 10/02/15 1126

## 2015-10-11 ENCOUNTER — Other Ambulatory Visit: Payer: Self-pay | Admitting: Surgery

## 2016-12-29 DIAGNOSIS — R110 Nausea: Secondary | ICD-10-CM | POA: Diagnosis not present

## 2016-12-29 DIAGNOSIS — R197 Diarrhea, unspecified: Secondary | ICD-10-CM | POA: Diagnosis not present

## 2017-01-01 ENCOUNTER — Emergency Department (HOSPITAL_COMMUNITY): Payer: PPO

## 2017-01-01 ENCOUNTER — Emergency Department (HOSPITAL_COMMUNITY)
Admission: EM | Admit: 2017-01-01 | Discharge: 2017-01-01 | Disposition: A | Discharge status: Home / Self Care | Payer: PPO | Attending: Emergency Medicine | Admitting: Emergency Medicine

## 2017-01-01 DIAGNOSIS — Z7984 Long term (current) use of oral hypoglycemic drugs: Secondary | ICD-10-CM | POA: Insufficient documentation

## 2017-01-01 DIAGNOSIS — I10 Essential (primary) hypertension: Secondary | ICD-10-CM | POA: Insufficient documentation

## 2017-01-01 DIAGNOSIS — R1110 Vomiting, unspecified: Secondary | ICD-10-CM | POA: Insufficient documentation

## 2017-01-01 DIAGNOSIS — H532 Diplopia: Secondary | ICD-10-CM | POA: Diagnosis not present

## 2017-01-01 DIAGNOSIS — R82998 Other abnormal findings in urine: Secondary | ICD-10-CM | POA: Insufficient documentation

## 2017-01-01 DIAGNOSIS — Z87891 Personal history of nicotine dependence: Secondary | ICD-10-CM | POA: Diagnosis not present

## 2017-01-01 DIAGNOSIS — R112 Nausea with vomiting, unspecified: Secondary | ICD-10-CM | POA: Insufficient documentation

## 2017-01-01 DIAGNOSIS — E860 Dehydration: Secondary | ICD-10-CM

## 2017-01-01 DIAGNOSIS — E119 Type 2 diabetes mellitus without complications: Secondary | ICD-10-CM | POA: Diagnosis not present

## 2017-01-01 DIAGNOSIS — Z79899 Other long term (current) drug therapy: Secondary | ICD-10-CM | POA: Diagnosis not present

## 2017-01-01 DIAGNOSIS — H547 Unspecified visual loss: Secondary | ICD-10-CM | POA: Diagnosis not present

## 2017-01-01 LAB — CBC WITH DIFFERENTIAL/PLATELET
Basophils Absolute: 0 10*3/uL (ref 0.0–0.1)
Basophils Relative: 0 %
Eosinophils Absolute: 0 10*3/uL (ref 0.0–0.7)
Eosinophils Relative: 0 %
HCT: 41.8 % (ref 36.0–46.0)
Hemoglobin: 14.4 g/dL (ref 12.0–15.0)
Lymphocytes Relative: 12 %
Lymphs Abs: 1.1 10*3/uL (ref 0.7–4.0)
MCH: 29.3 pg (ref 26.0–34.0)
MCHC: 34.4 g/dL (ref 30.0–36.0)
MCV: 85 fL (ref 78.0–100.0)
Monocytes Absolute: 0.2 10*3/uL (ref 0.1–1.0)
Monocytes Relative: 2 %
Neutro Abs: 7.9 10*3/uL — ABNORMAL HIGH (ref 1.7–7.7)
Neutrophils Relative %: 86 %
Platelets: 235 10*3/uL (ref 150–400)
RBC: 4.92 MIL/uL (ref 3.87–5.11)
RDW: 13.4 % (ref 11.5–15.5)
WBC: 9.2 10*3/uL (ref 4.0–10.5)

## 2017-01-01 LAB — COMPREHENSIVE METABOLIC PANEL
ALT: 27 U/L (ref 14–54)
AST: 22 U/L (ref 15–41)
Albumin: 4.6 g/dL (ref 3.5–5.0)
Alkaline Phosphatase: 91 U/L (ref 38–126)
Anion gap: 16 — ABNORMAL HIGH (ref 5–15)
BUN: 18 mg/dL (ref 6–20)
CO2: 25 mmol/L (ref 22–32)
Calcium: 9.1 mg/dL (ref 8.9–10.3)
Chloride: 91 mmol/L — ABNORMAL LOW (ref 101–111)
Creatinine, Ser: 0.96 mg/dL (ref 0.44–1.00)
GFR calc Af Amer: >60 mL/min (ref >60)
GFR calc non Af Amer: >60 mL/min (ref >60)
Glucose, Bld: 247 mg/dL — ABNORMAL HIGH (ref 65–99)
Potassium: 4.2 mmol/L (ref 3.5–5.1)
Sodium: 132 mmol/L — ABNORMAL LOW (ref 135–145)
Total Bilirubin: 0.7 mg/dL (ref 0.3–1.2)
Total Protein: 8.5 g/dL — ABNORMAL HIGH (ref 6.5–8.1)

## 2017-01-01 LAB — URINALYSIS, ROUTINE W REFLEX MICROSCOPIC
Bacteria, UA: NONE SEEN
Bilirubin Urine: NEGATIVE
Glucose, UA: 150 mg/dL — AB
Hgb urine dipstick: NEGATIVE
Ketones, ur: 80 mg/dL — AB
Nitrite: NEGATIVE
Protein, ur: 30 mg/dL — AB
Specific Gravity, Urine: 1.021 (ref 1.005–1.030)
pH: 5 (ref 5.0–8.0)

## 2017-01-01 MED ORDER — ONDANSETRON HCL 4 MG/2ML IJ SOLN
4.0000 mg | Freq: Once | INTRAMUSCULAR | Status: AC
  Administered 2017-01-01: 4 mg via INTRAVENOUS
  Filled 2017-01-01: qty 2

## 2017-01-01 MED ORDER — ONDANSETRON 4 MG PO TBDP: ORAL_TABLET | ORAL | 0 refills | Status: AC

## 2017-01-01 MED ORDER — ACETAMINOPHEN 500 MG PO TABS
1000.0000 mg | ORAL_TABLET | Freq: Once | ORAL | Status: AC
  Administered 2017-01-01: 1000 mg via ORAL
  Filled 2017-01-01: qty 2

## 2017-01-01 MED ORDER — CEPHALEXIN 500 MG PO CAPS: 500.0000 mg | ORAL_CAPSULE | Freq: Four times a day (QID) | ORAL | 0 refills | Status: AC

## 2017-01-01 MED ORDER — LORAZEPAM 2 MG/ML IJ SOLN
0.5000 mg | Freq: Once | INTRAMUSCULAR | Status: AC
  Administered 2017-01-01: 0.5 mg via INTRAVENOUS
  Filled 2017-01-01: qty 1

## 2017-01-01 MED ORDER — MORPHINE SULFATE (PF) 4 MG/ML IV SOLN: 4.0000 mg | Freq: Once | INTRAVENOUS | Status: DC

## 2017-01-01 MED ORDER — TRAMADOL HCL 50 MG PO TABS: ORAL_TABLET | ORAL | 0 refills | Status: AC

## 2017-01-01 MED ORDER — SODIUM CHLORIDE 0.9 % IV BOLUS (SEPSIS)
2000.0000 mL | Freq: Once | INTRAVENOUS | Status: AC
  Administered 2017-01-01: 2000 mL via INTRAVENOUS

## 2017-01-01 NOTE — Discharge Instructions (Signed)
Follow up with your eye md this week.  Follow up with your family md next week to check your urine

## 2017-01-01 NOTE — ED Notes (Signed)
Patient transported to MRI 

## 2017-01-01 NOTE — ED Provider Notes (Signed)
[place] DEPT Provider Note   CSN: [id number] Arrival date & time: 01/01/17  0604     History   Chief Complaint Chief Complaint  Patient presents with  . Dehydration  . Emesis    HPI Alyssa Stevens is a 66 y.o. female.  Patient states that she's had nausea vomiting for over week and she saw her doctor Friday and was told to come here she continued with vomiting. Patient also complains of a headache and she's had some dark vision and since last night she noticed some double vision.   The history is provided by the patient.  Emesis   This is a new problem. The current episode started more than 2 days ago. The problem occurs 2 to 4 times per day. The problem has not changed since onset.The emesis has an appearance of stomach contents. There has been no fever. Pertinent negatives include no abdominal pain, no chills, no cough, no diarrhea and no headaches.    Past Medical History:  Diagnosis Date  . Anemia   . Anemia   . Diabetes mellitus without complication ([place])   . Hypercholesterolemia   . Hypertension     There are no active problems to display for this patient.   Past Surgical History:  Procedure Laterality Date  . COLONOSCOPY WITH PROPOFOL N/A 09/15/2015   Procedure: COLONOSCOPY WITH PROPOFOL;  Surgeon: Clarene Essex, MD;  Location: [facility] ENDOSCOPY;  Service: Endoscopy;  Laterality: N/A;  . FRACTURE SURGERY     left leg  . HOT HEMOSTASIS N/A 09/15/2015   Procedure: HOT HEMOSTASIS (ARGON PLASMA COAGULATION/BICAP);  Surgeon: Clarene Essex, MD;  Location: [facility] ENDOSCOPY;  Service: Endoscopy;  Laterality: N/A;    OB History    No data available       Home Medications    Prior to Admission medications   Medication Sig Start Date End Date Taking? Authorizing Provider  Calcium Carb-Cholecalciferol (CALCIUM 600+D3 PO) Take 1 tablet by mouth daily.   Yes [provider]  Cholecalciferol (VITAMIN D) 2000 units CAPS Take 1 capsule by mouth daily.    Yes  [provider]  iron polysaccharides (FERREX 150) 150 MG capsule Take 150 mg by mouth daily.   Yes [provider]  lisinopril-hydrochlorothiazide (PRINZIDE,ZESTORETIC) 20-25 MG tablet Take 1 tablet by mouth daily.   Yes [provider]  metFORMIN (GLUMETZA) 500 MG (MOD) 24 hr tablet Take 2,000 mg by mouth at bedtime.   Yes [provider]  naproxen sodium (ANAPROX) 220 MG tablet Take 220 mg by mouth every 12 (twelve) hours as needed (for pain).   Yes [provider]  pravastatin (PRAVACHOL) 20 MG tablet Take 20 mg by mouth daily.   Yes [provider]  cephALEXin (KEFLEX) 500 MG capsule Take 1 capsule (500 mg total) by mouth 4 (four) times daily. 01/01/17   Milton Ferguson, MD  ondansetron (ZOFRAN ODT) 4 MG disintegrating tablet 4mg  ODT q4 hours prn nausea/vomit 01/01/17   Milton Ferguson, MD  traMADol Veatrice Bourbon) 50 MG tablet Take one every 8 hours for pain not helped by tylenol or motrin 01/01/17   Milton Ferguson, MD    Family History Family History  Problem Relation Age of Onset  . Diabetes Mother   . Cancer Mother   . Heart attack Father   . Diabetes Sister     Social History Social History  Substance Use Topics  . Smoking status: Former Smoker    Types: Cigarettes  . Smokeless tobacco: Never Used  Comment:  " Quit smoking cigarettes in 2001"  . Alcohol use Yes     Comment: social     Allergies   Trulicity [dulaglutide] and Jardiance [empagliflozin]   Review of Systems Review of Systems  Constitutional: Negative for appetite change, chills and fatigue.  HENT: Negative for congestion, ear discharge and sinus pressure.        Visual problem  Eyes: Negative for discharge.  Respiratory: Negative for cough.   Cardiovascular: Negative for chest pain.  Gastrointestinal: Positive for vomiting. Negative for abdominal pain and diarrhea.  Genitourinary: Negative for frequency and hematuria.  Musculoskeletal: Negative for back  pain.  Skin: Negative for rash.  Neurological: Negative for seizures and headaches.  Psychiatric/Behavioral: Negative for hallucinations.     Physical Exam Updated Vital Signs BP 139/67 (BP Location: Right Arm)   Pulse 96   Temp 99.7 F (37.6 C) (Oral)   Resp 14   SpO2 96%   Physical Exam  Constitutional: She is oriented to person, place, and time. She appears well-developed.  HENT:  Head: Normocephalic.  Initial exam. Extra ocular motion showed the right eye to be slow compared to the left eye.  Pupils equal react to to light and accommodation. Disks sharp  Eyes: Conjunctivae and EOM are normal. No scleral icterus.  Neck: Neck supple. No thyromegaly present.  Cardiovascular: Normal rate and regular rhythm.  Exam reveals no gallop and no friction rub.   No murmur heard. Pulmonary/Chest: No stridor. She has no wheezes. She has no rales. She exhibits no tenderness.  Abdominal: She exhibits no distension. There is no tenderness. There is no rebound.  Musculoskeletal: Normal range of motion. She exhibits no edema.  Lymphadenopathy:    She has no cervical adenopathy.  Neurological: She is oriented to person, place, and time. She exhibits normal muscle tone. Coordination normal.  Skin: No rash noted. No erythema.  Psychiatric: She has a normal mood and affect. Her behavior is normal.     ED Treatments / Results  Labs (all labs ordered are listed, but only abnormal results are displayed) Labs Reviewed  CBC WITH DIFFERENTIAL/PLATELET - Abnormal; Notable for the following:       Result Value   Neutro Abs 7.9 (*)    All other components within normal limits  COMPREHENSIVE METABOLIC PANEL - Abnormal; Notable for the following:    Sodium 132 (*)    Chloride 91 (*)    Glucose, Bld 247 (*)    Total Protein 8.5 (*)    Anion gap 16 (*)    All other components within normal limits  URINALYSIS, ROUTINE W REFLEX MICROSCOPIC - Abnormal; Notable for the following:    APPearance CLOUDY  (*)    Glucose, UA 150 (*)    Ketones, ur 80 (*)    Protein, ur 30 (*)    Leukocytes, UA SMALL (*)    Squamous Epithelial / LPF 0-5 (*)    All other components within normal limits  URINE CULTURE    EKG  EKG Interpretation None       Radiology Mr Brain Wo Contrast  Result Date: 01/01/2017 CLINICAL DATA:  Visual loss. Nausea, vomiting, diarrhea, and dehydration. EXAM: MRI HEAD WITHOUT CONTRAST TECHNIQUE: Multiplanar, multiecho pulse sequences of the brain and surrounding structures were obtained without intravenous contrast. COMPARISON:  None. FINDINGS: The examination had to be discontinued prior to completion due to claustrophobia. Sagittal T1, axial and coronal diffusion, and axial T2 sequences were obtained. Brain: Diffusion imaging is of diagnostic  quality, and there is no evidence of acute infarct. No gross intracranial hemorrhage, intracranial mass effect, or extra-axial fluid collection is seen. The ventricles and sulci are normal in size for age. Small foci of T2 hyperintensity in the cerebral white matter bilaterally are nonspecific but compatible with mild chronic small vessel ischemic disease. Vascular: Major intracranial vascular flow voids are preserved. Skull and upper cervical spine: Unremarkable bone marrow signal. Sinuses/Orbits: Unremarkable orbits. Small volume left sphenoid sinus fluid. Clear mastoid air cells. Other: None. IMPRESSION: 1. Incomplete examination.  No acute infarct. 2. Mild chronic small vessel ischemic disease. Electronically Signed   By: Logan Bores M.D.   On: 01/01/2017 12:02    Procedures Procedures (including critical care time)  Medications Ordered in ED Medications  morphine 4 MG/ML injection 4 mg (not administered)  sodium chloride 0.9 % bolus 2,000 mL (0 mLs Intravenous Stopped 01/01/17 1040)  ondansetron (ZOFRAN) injection 4 mg (4 mg Intravenous Given 01/01/17 0813)  LORazepam (ATIVAN) injection 0.5 mg (0.5 mg Intravenous Given 01/01/17 1111)       Initial Impression / Assessment and Plan / ED Course  I have reviewed the triage vital signs and the nursing notes.  Pertinent labs & imaging results that were available during my care of the patient were reviewed by me and considered in my medical decision making (see chart for details).   patient's extraocular motion improved at discharge but she still stated she had double vision. Neurology was counseled on the phone and since patient had normal MRI it was recommended that the patient follows up with ophthalmology this week. Patient will also be started on antibiotics for urinary tract infection and given nausea medicine    Final Clinical Impressions(s) / ED Diagnoses   Final diagnoses:  Dehydration  Double vision    New Prescriptions New Prescriptions   CEPHALEXIN (KEFLEX) 500 MG CAPSULE    Take 1 capsule (500 mg total) by mouth 4 (four) times daily.   ONDANSETRON (ZOFRAN ODT) 4 MG DISINTEGRATING TABLET    4mg  ODT q4 hours prn nausea/vomit   TRAMADOL (ULTRAM) 50 MG TABLET    Take one every 8 hours for pain not helped by tylenol or Fredrik Cove, MD 01/01/17 1345

## 2017-01-01 NOTE — ED Triage Notes (Signed)
Pt came to ED after getting a call from her PMD telling her that the lab work they had done shows that she is dehydrated. Alyssa Stevens has been having nausea and vomiting with diarrhea for the last week along wth abd pain cramping and headaches

## 2017-01-02 ENCOUNTER — Emergency Department (HOSPITAL_COMMUNITY): Payer: PPO

## 2017-01-02 ENCOUNTER — Encounter (HOSPITAL_COMMUNITY): Payer: Self-pay

## 2017-01-02 ENCOUNTER — Emergency Department (HOSPITAL_COMMUNITY)
Admission: EM | Admit: 2017-01-02 | Discharge: 2017-01-03 | Disposition: A | Discharge status: Home / Self Care | Payer: PPO | Attending: Emergency Medicine | Admitting: Emergency Medicine

## 2017-01-02 DIAGNOSIS — I10 Essential (primary) hypertension: Secondary | ICD-10-CM | POA: Diagnosis not present

## 2017-01-02 DIAGNOSIS — Z87891 Personal history of nicotine dependence: Secondary | ICD-10-CM | POA: Insufficient documentation

## 2017-01-02 DIAGNOSIS — R519 Headache, unspecified: Secondary | ICD-10-CM

## 2017-01-02 DIAGNOSIS — Z79899 Other long term (current) drug therapy: Secondary | ICD-10-CM | POA: Insufficient documentation

## 2017-01-02 DIAGNOSIS — Z7984 Long term (current) use of oral hypoglycemic drugs: Secondary | ICD-10-CM | POA: Insufficient documentation

## 2017-01-02 DIAGNOSIS — R509 Fever, unspecified: Secondary | ICD-10-CM

## 2017-01-02 DIAGNOSIS — R51 Headache: Secondary | ICD-10-CM

## 2017-01-02 DIAGNOSIS — K8020 Calculus of gallbladder without cholecystitis without obstruction: Secondary | ICD-10-CM

## 2017-01-02 DIAGNOSIS — N390 Urinary tract infection, site not specified: Secondary | ICD-10-CM | POA: Diagnosis not present

## 2017-01-02 DIAGNOSIS — E119 Type 2 diabetes mellitus without complications: Secondary | ICD-10-CM | POA: Diagnosis not present

## 2017-01-02 DIAGNOSIS — N281 Cyst of kidney, acquired: Secondary | ICD-10-CM | POA: Diagnosis not present

## 2017-01-02 DIAGNOSIS — R109 Unspecified abdominal pain: Secondary | ICD-10-CM | POA: Diagnosis not present

## 2017-01-02 DIAGNOSIS — R1110 Vomiting, unspecified: Secondary | ICD-10-CM | POA: Diagnosis not present

## 2017-01-02 LAB — CBC WITH DIFFERENTIAL/PLATELET
Basophils Absolute: 0 10*3/uL (ref 0.0–0.1)
Basophils Relative: 0 %
Eosinophils Absolute: 0 10*3/uL (ref 0.0–0.7)
Eosinophils Relative: 0 %
HCT: 38.1 % (ref 36.0–46.0)
Hemoglobin: 12.9 g/dL (ref 12.0–15.0)
Lymphocytes Relative: 17 %
Lymphs Abs: 2 10*3/uL (ref 0.7–4.0)
MCH: 29.3 pg (ref 26.0–34.0)
MCHC: 33.9 g/dL (ref 30.0–36.0)
MCV: 86.4 fL (ref 78.0–100.0)
Monocytes Absolute: 0.7 10*3/uL (ref 0.1–1.0)
Monocytes Relative: 6 %
Neutro Abs: 9.1 10*3/uL — ABNORMAL HIGH (ref 1.7–7.7)
Neutrophils Relative %: 77 %
Platelets: 209 10*3/uL (ref 150–400)
RBC: 4.41 MIL/uL (ref 3.87–5.11)
RDW: 13.5 % (ref 11.5–15.5)
WBC: 11.8 10*3/uL — ABNORMAL HIGH (ref 4.0–10.5)

## 2017-01-02 LAB — COMPREHENSIVE METABOLIC PANEL
ALT: 18 U/L (ref 14–54)
AST: 16 U/L (ref 15–41)
Albumin: 4 g/dL (ref 3.5–5.0)
Alkaline Phosphatase: 82 U/L (ref 38–126)
Anion gap: 12 (ref 5–15)
BUN: 13 mg/dL (ref 6–20)
CO2: 26 mmol/L (ref 22–32)
Calcium: 8.5 mg/dL — ABNORMAL LOW (ref 8.9–10.3)
Chloride: 96 mmol/L — ABNORMAL LOW (ref 101–111)
Creatinine, Ser: 0.88 mg/dL (ref 0.44–1.00)
GFR calc Af Amer: >60 mL/min (ref >60)
GFR calc non Af Amer: >60 mL/min (ref >60)
Glucose, Bld: 156 mg/dL — ABNORMAL HIGH (ref 65–99)
Potassium: 3.5 mmol/L (ref 3.5–5.1)
Sodium: 134 mmol/L — ABNORMAL LOW (ref 135–145)
Total Bilirubin: 0.7 mg/dL (ref 0.3–1.2)
Total Protein: 7.5 g/dL (ref 6.5–8.1)

## 2017-01-02 LAB — URINALYSIS, ROUTINE W REFLEX MICROSCOPIC
Bilirubin Urine: NEGATIVE
Glucose, UA: NEGATIVE mg/dL
Hgb urine dipstick: NEGATIVE
Nitrite: NEGATIVE
Protein, ur: NEGATIVE mg/dL
Specific Gravity, Urine: <1.005 — ABNORMAL LOW (ref 1.005–1.030)
pH: 5.5 (ref 5.0–8.0)

## 2017-01-02 LAB — URINE CULTURE

## 2017-01-02 LAB — URINALYSIS, MICROSCOPIC (REFLEX)

## 2017-01-02 LAB — I-STAT CG4 LACTIC ACID, ED: Lactic Acid, Venous: 1.51 mmol/L (ref 0.5–1.9)

## 2017-01-02 MED ORDER — ACETAMINOPHEN 325 MG PO TABS: 650.0000 mg | ORAL_TABLET | Freq: Once | ORAL | Status: DC | PRN

## 2017-01-02 MED ORDER — FENTANYL CITRATE (PF) 100 MCG/2ML IJ SOLN
50.0000 ug | INTRAMUSCULAR | Status: DC | PRN
  Administered 2017-01-03: 50 ug via INTRAVENOUS
  Filled 2017-01-02: qty 2

## 2017-01-02 MED ORDER — IOPAMIDOL (ISOVUE-300) INJECTION 61%
INTRAVENOUS | Status: AC
  Filled 2017-01-02: qty 30

## 2017-01-02 MED ORDER — IOPAMIDOL (ISOVUE-300) INJECTION 61%
30.0000 mL | Freq: Once | INTRAVENOUS | Status: AC | PRN
  Administered 2017-01-02: 30 mL via ORAL

## 2017-01-02 MED ORDER — AMOXICILLIN 500 MG PO CAPS
500.0000 mg | ORAL_CAPSULE | Freq: Once | ORAL | Status: AC
  Administered 2017-01-03: 500 mg via ORAL
  Filled 2017-01-02: qty 1

## 2017-01-02 MED ORDER — SODIUM CHLORIDE 0.9 % IV BOLUS (SEPSIS)
1000.0000 mL | Freq: Once | INTRAVENOUS | Status: AC
  Administered 2017-01-02: 1000 mL via INTRAVENOUS

## 2017-01-02 MED ORDER — IOPAMIDOL (ISOVUE-300) INJECTION 61%
INTRAVENOUS | Status: AC
  Filled 2017-01-02: qty 100

## 2017-01-02 MED ORDER — ONDANSETRON HCL 4 MG/2ML IJ SOLN
4.0000 mg | Freq: Once | INTRAMUSCULAR | Status: AC
  Administered 2017-01-02: 4 mg via INTRAVENOUS
  Filled 2017-01-02: qty 2

## 2017-01-02 MED ORDER — IOPAMIDOL (ISOVUE-300) INJECTION 61%
100.0000 mL | Freq: Once | INTRAVENOUS | Status: AC | PRN
  Administered 2017-01-02: 100 mL via INTRAVENOUS

## 2017-01-02 MED ORDER — ONDANSETRON HCL 4 MG/2ML IJ SOLN
4.0000 mg | Freq: Once | INTRAMUSCULAR | Status: AC
  Administered 2017-01-03: 4 mg via INTRAVENOUS
  Filled 2017-01-02: qty 2

## 2017-01-02 MED ORDER — SODIUM CHLORIDE 0.9 % IV SOLN
INTRAVENOUS | Status: DC
  Administered 2017-01-03: 20 mL/h via INTRAVENOUS

## 2017-01-02 NOTE — ED Notes (Signed)
Patient walked to bathroom again to try to give urine specimen.

## 2017-01-02 NOTE — ED Notes (Signed)
Dr. Wentz at bedside. 

## 2017-01-02 NOTE — ED Notes (Signed)
Patient tried to give urine specimen but was unable to. Patient wants to wait and try again after she gets more fluids to try and urinate again.

## 2017-01-02 NOTE — ED Provider Notes (Signed)
[place] DEPT Provider Note   CSN: [id number] Arrival date & time: 01/02/17  5643     History   Chief Complaint Chief Complaint  Patient presents with  . Headache  . Dizziness  . Fever    HPI Alyssa Stevens is a 66 y.o. female.  She presents for evaluation of malaise for 2 weeks associated with decreased oral intake, episodes of vomiting, fever, fatigue, and weakness.  She was evaluated by her PCP, 5 days ago at that time had a urine culture which is reported to me by family members as positive for infection, a bacteria sensitive to amoxicillin.  Today they called her PCP who recommended that she come here for further evaluation.  Family members are concerned about her welfare and under the impression that she will get a "full body MRI,", to evaluate her problem.  Yesterday she was evaluated in the ED at that time there was concern for headache, and dizziness, so an MRI of the brain was done which was negative for CVA, and she was sent home with a prescription for cephalexin, to treat suspected urinary tract infection.  Her condition worsened today, and was associated with a temperature of 102, which was treated with Tylenol with some improvement.  Patient continues to complain of headache, frontal aspect.  She denies neck or back pain.  She denies abdominal pain.  She had a similar episode, but a year ago when she had urinary tract infection and required treatment in the hospital.  No known sick contacts.  Family members report that she has a history of "gallstones."  She has not had a cholecystectomy.  There are no other known modifying factors.  HPI  Past Medical History:  Diagnosis Date  . Anemia   . Anemia   . Diabetes mellitus without complication ([place])   . Hypercholesterolemia   . Hypertension     There are no active problems to display for this patient.   Past Surgical History:  Procedure Laterality Date  . COLONOSCOPY WITH PROPOFOL N/A 09/15/2015   Procedure:  COLONOSCOPY WITH PROPOFOL;  Surgeon: Clarene Essex, MD;  Location: [facility] ENDOSCOPY;  Service: Endoscopy;  Laterality: N/A;  . FRACTURE SURGERY     left leg  . HOT HEMOSTASIS N/A 09/15/2015   Procedure: HOT HEMOSTASIS (ARGON PLASMA COAGULATION/BICAP);  Surgeon: Clarene Essex, MD;  Location: [facility] ENDOSCOPY;  Service: Endoscopy;  Laterality: N/A;    OB History    No data available       Home Medications    Prior to Admission medications   Medication Sig Start Date End Date Taking? Authorizing Provider  amoxicillin-clavulanate (AUGMENTIN) 875-125 MG tablet Take 1 tablet by mouth 2 (two) times daily.  01/01/17  Yes [provider]  Calcium Carb-Cholecalciferol (CALCIUM 600+D3 PO) Take 1 tablet by mouth daily.   Yes [provider]  cephALEXin (KEFLEX) 500 MG capsule Take 1 capsule (500 mg total) by mouth 4 (four) times daily. 01/01/17  Yes Milton Ferguson, MD  iron polysaccharides (FERREX 150) 150 MG capsule Take 150 mg by mouth daily.   Yes [provider]  lisinopril-hydrochlorothiazide (PRINZIDE,ZESTORETIC) 20-25 MG tablet Take 1 tablet by mouth daily.   Yes [provider]  metFORMIN (GLUMETZA) 500 MG (MOD) 24 hr tablet Take 2,000 mg by mouth at bedtime.   Yes [provider]  ondansetron (ZOFRAN ODT) 4 MG disintegrating tablet 4mg  ODT q4 hours prn nausea/vomit 01/01/17  Yes Milton Ferguson, MD  pravastatin (PRAVACHOL) 20 MG tablet Take 20  mg by mouth daily.   Yes [provider]  traMADol (ULTRAM) 50 MG tablet Take one every 8 hours for pain not helped by tylenol or motrin 01/01/17  Yes Milton Ferguson, MD  naproxen sodium (ANAPROX) 220 MG tablet Take 220 mg by mouth every 12 (twelve) hours as needed (for pain).    [provider]    Family History Family History  Problem Relation Age of Onset  . Diabetes Mother   . Cancer Mother   . Heart attack Father   . Diabetes Sister     Social History Social History  Substance Use Topics  .  Smoking status: Former Smoker    Types: Cigarettes  . Smokeless tobacco: Never Used     Comment:  " Quit smoking cigarettes in 2001"  . Alcohol use Yes     Comment: social     Allergies   Trulicity [dulaglutide] and Jardiance [empagliflozin]   Review of Systems Review of Systems  All other systems reviewed and are negative.    Physical Exam Updated Vital Signs BP 115/63   Pulse 85   Temp 99.5 F (37.5 C) (Oral)   Resp 14   Ht 5\' 5"  (1.651 m)   Wt 90.7 kg (200 lb)   SpO2 92%   BMI 33.28 kg/m   Physical Exam  Constitutional: She is oriented to person, place, and time. She appears well-developed. No distress.  Overweight, elderly patient  HENT:  Head: Normocephalic and atraumatic.  Eyes: Pupils are equal, round, and reactive to light. Conjunctivae and EOM are normal.  Neck: Normal range of motion and phonation normal. Neck supple.  Cardiovascular: Normal rate and regular rhythm.   Pulmonary/Chest: Effort normal and breath sounds normal. No respiratory distress. She exhibits no tenderness.  Abdominal: Soft. She exhibits no distension and no mass. There is no tenderness. There is no guarding.  Genitourinary:  Genitourinary Comments: No costovertebral angle tenderness with percussion.  Musculoskeletal: Normal range of motion.  Neurological: She is alert and oriented to person, place, and time. She exhibits normal muscle tone.  Skin: Skin is warm and dry.  Psychiatric: She has a normal mood and affect. Her behavior is normal. Judgment and thought content normal.  Nursing note and vitals reviewed.    ED Treatments / Results  Labs (all labs ordered are listed, but only abnormal results are displayed) Labs Reviewed  CBC WITH DIFFERENTIAL/PLATELET - Abnormal; Notable for the following:       Result Value   WBC 11.8 (*)    Neutro Abs 9.1 (*)    All other components within normal limits  COMPREHENSIVE METABOLIC PANEL - Abnormal; Notable for the following:    Sodium  134 (*)    Chloride 96 (*)    Glucose, Bld 156 (*)    Calcium 8.5 (*)    All other components within normal limits  URINALYSIS, ROUTINE W REFLEX MICROSCOPIC - Abnormal; Notable for the following:    Specific Gravity, Urine <1.005 (*)    Ketones, ur TRACE (*)    Leukocytes, UA SMALL (*)    All other components within normal limits  URINALYSIS, MICROSCOPIC (REFLEX) - Abnormal; Notable for the following:    Bacteria, UA RARE (*)    Squamous Epithelial / LPF 0-5 (*)    All other components within normal limits  CULTURE, BLOOD (ROUTINE X 2)  CULTURE, BLOOD (ROUTINE X 2)  URINE CULTURE  I-STAT CG4 LACTIC ACID, ED    EKG  EKG Interpretation None  Radiology Mr Brain Wo Contrast  Result Date: 01/01/2017 CLINICAL DATA:  Visual loss. Nausea, vomiting, diarrhea, and dehydration. EXAM: MRI HEAD WITHOUT CONTRAST TECHNIQUE: Multiplanar, multiecho pulse sequences of the brain and surrounding structures were obtained without intravenous contrast. COMPARISON:  None. FINDINGS: The examination had to be discontinued prior to completion due to claustrophobia. Sagittal T1, axial and coronal diffusion, and axial T2 sequences were obtained. Brain: Diffusion imaging is of diagnostic quality, and there is no evidence of acute infarct. No gross intracranial hemorrhage, intracranial mass effect, or extra-axial fluid collection is seen. The ventricles and sulci are normal in size for age. Small foci of T2 hyperintensity in the cerebral white matter bilaterally are nonspecific but compatible with mild chronic small vessel ischemic disease. Vascular: Major intracranial vascular flow voids are preserved. Skull and upper cervical spine: Unremarkable bone marrow signal. Sinuses/Orbits: Unremarkable orbits. Small volume left sphenoid sinus fluid. Clear mastoid air cells. Other: None. IMPRESSION: 1. Incomplete examination.  No acute infarct. 2. Mild chronic small vessel ischemic disease. Electronically Signed   By:  Logan Bores M.D.   On: 01/01/2017 12:02   Ct Abdomen Pelvis W Contrast  Result Date: 01/02/2017 CLINICAL DATA:  Headache, dizziness and fever to 102.6 degrees at home, abdominal pain, vomiting, suspected colitis of gastroenteritis, history hypertension, diabetes mellitus, former smoker EXAM: CT ABDOMEN AND PELVIS WITH CONTRAST TECHNIQUE: Multidetector CT imaging of the abdomen and pelvis was performed using the standard protocol following bolus administration of intravenous contrast. Sagittal and coronal MPR images reconstructed from axial data set. CONTRAST:  44mL ISOVUE-300 IOPAMIDOL (ISOVUE-300) INJECTION 61% PO, 143mL ISOVUE-300 IOPAMIDOL (ISOVUE-300) INJECTION 61% IV COMPARISON:  10/02/2015 FINDINGS: Lower chest: Atelectasis at base of lingula. Hepatobiliary: Diffuse fatty infiltration of liver. Dependent tiny gallstones within gallbladder. No biliary dilatation. Pancreas: Normal appearance Spleen: Normal appearance.  Small splenule inferior to spleen. Adrenals/Urinary Tract: Adrenal glands normal appearance. Two LEFT renal cysts, larger 5.3 x 5.0 cm image 31. Kidneys, ureters, and bladder otherwise normal appearance. Stomach/Bowel: Normal appendix. Stomach and bowel loops normal appearance. Vascular/Lymphatic: Atherosclerotic calcifications aorta and iliac arteries without aneurysm. No adenopathy. Reproductive: Unremarkable uterus and adnexa Other: No free air or free fluid.  No hernia. Musculoskeletal: Large lipoma of the RIGHT lateral abdominal wall at the external oblique, approximately 13.2 x 2.6 x 13.6 cm. Bones diffusely demineralized. No acute osseous findings. IMPRESSION: Cholelithiasis. Fatty infiltration of liver. LEFT renal cyst. No acute intra-abdominal or intrapelvic abnormalities. Aortic Atherosclerosis (ICD10-I70.0). Electronically Signed   By: Lavonia Dana M.D.   On: 01/02/2017 22:47    Procedures Procedures (including critical care time)  Medications Ordered in ED Medications    acetaminophen (TYLENOL) tablet 650 mg (not administered)  0.9 %  sodium chloride infusion (not administered)  iopamidol (ISOVUE-300) 61 % injection (not administered)  iopamidol (ISOVUE-300) 61 % injection (not administered)  amoxicillin (AMOXIL) capsule 500 mg (not administered)  fentaNYL (SUBLIMAZE) injection 50 mcg (not administered)  ondansetron (ZOFRAN) injection 4 mg (not administered)  sodium chloride 0.9 % bolus 1,000 mL (0 mLs Intravenous Stopped 01/02/17 2215)  ondansetron (ZOFRAN) injection 4 mg (4 mg Intravenous Given 01/02/17 1934)  sodium chloride 0.9 % bolus 1,000 mL (0 mLs Intravenous Stopped 01/02/17 2351)  iopamidol (ISOVUE-300) 61 % injection 30 mL (30 mLs Oral Contrast Given 01/02/17 1906)  iopamidol (ISOVUE-300) 61 % injection 100 mL (100 mLs Intravenous Contrast Given 01/02/17 2206)     Initial Impression / Assessment and Plan / ED Course  I have reviewed the triage vital signs  and the nursing notes.  Pertinent labs & imaging results that were available during my care of the patient were reviewed by me and considered in my medical decision making (see chart for details).  Clinical Course as of Jan 03 6  Tue Jan 02, 2017  2350 Hemoglobin: 12.9 [EW]  Wed Jan 03, 2017  0004 Normal Lactic Acid, Venous: 1.51 [EW]  0004 Normal WBC, UA: 0-5 [EW]  0004 Nonspecific, cath sample Bacteria, UA: (!) RARE [EW]  0005 Slight elevation Leukocytes, UA: (!) SMALL [EW]  0005 Slight elevation Ketones, ur: (!) TRACE [EW]  0005 Slightly low Sodium: (!) 134 [EW]  0005 Mild elevation Glucose: (!) 156 [EW]  0005 Gallstones present no acute intra-abdominal process seen CT Abdomen Pelvis W Contrast [EW]    Clinical Course User Index [EW] Daleen Bo, MD     Patient Vitals for the past 24 hrs:  BP Temp Temp src Pulse Resp SpO2 Height Weight  01/02/17 2330 115/63 - - 85 - 92 % - -  01/02/17 2300 117/62 - - 87 - 93 % - -  01/02/17 2243 127/63 - - 95 14 98 % - -  01/02/17 2200 (!)  111/58 - - - - - - -  01/02/17 2130 129/61 - - 89 - 96 % - -  01/02/17 2030 (!) 122/54 - - 87 - 100 % - -  01/02/17 2000 (!) 115/55 - - 85 - 97 % - -  01/02/17 1937 (!) 120/56 - - 86 18 98 % - -  01/02/17 1835 (!) 124/51 99.5 F (37.5 C) Oral 84 20 98 % - -  01/02/17 1748 - - - - - - 5\' 5"  (1.651 m) 90.7 kg (200 lb)  01/02/17 1740 125/69 99.1 F (37.3 C) Oral 84 - 96 % - -    12:01 AM Reevaluation with update and discussion. After initial assessment and treatment, an updated evaluation reveals she feels better, now but still has a headache and has difficulty opening her eyes, because the light hurts.  Abdomen is soft and nontender to palpation.  Patient and family members updated on findings and plan.  Ultrasound ordered to evaluate for the possibility of cholecystitis since she has gallstones, and fever with mild white blood cell count elevation, today.  Fentanyl ordered for headache. Sumrall to Dr. Dina Rich to evaluate after ultrasound imaging- 00:01   Final Clinical Impressions(s) / ED Diagnoses   Final diagnoses:  Fever, unspecified fever cause  Urinary tract infection without hematuria, site unspecified  Gallstones    Reported fever, with abdominal discomfort, vomiting and recent UTI, culture positive by report of family.  Evaluation here, with normal vital signs.  CT abdomen, is reassuring.  Patient requires evaluation for cholecystitis.  Lactate is normal.  Mild leukocytosis.  Nursing Notes Reviewed/ Care Coordinated Applicable Imaging Reviewed Interpretation of Laboratory Data incorporated into ED treatment  Disposition as per oncoming provider team  New Prescriptions New Prescriptions   No medications on file     Daleen Bo, MD 01/03/17 0007

## 2017-01-02 NOTE — ED Triage Notes (Signed)
Patient was seen in the ED yesterday for headache, vomiting, fever and double vision. Patient was discharged with an antibiotic for a UTI. Today, the patient has a headache, dizziness, and fever of 102.6 at home. Patient had Tylenol  And amoxicillin at 1430. patient's family states that the patient is not herself.

## 2017-01-02 NOTE — ED Notes (Signed)
Patient walked to bathroom with assistance from Probation officer and family member. Tolerated well.

## 2017-01-03 DIAGNOSIS — N281 Cyst of kidney, acquired: Secondary | ICD-10-CM | POA: Diagnosis not present

## 2017-01-03 MED ORDER — OMEPRAZOLE 20 MG PO CPDR: 20.0000 mg | DELAYED_RELEASE_CAPSULE | Freq: Every day | ORAL | 0 refills | Status: AC

## 2017-01-03 MED ORDER — GI COCKTAIL ~~LOC~~
30.0000 mL | Freq: Once | ORAL | Status: AC
  Administered 2017-01-03: 30 mL via ORAL
  Filled 2017-01-03: qty 30

## 2017-01-03 MED ORDER — DIPHENHYDRAMINE HCL 50 MG/ML IJ SOLN
12.5000 mg | Freq: Once | INTRAMUSCULAR | Status: AC
  Administered 2017-01-03: 12.5 mg via INTRAVENOUS
  Filled 2017-01-03: qty 1

## 2017-01-03 MED ORDER — PROCHLORPERAZINE EDISYLATE 5 MG/ML IJ SOLN
5.0000 mg | Freq: Once | INTRAMUSCULAR | Status: AC
  Administered 2017-01-03: 5 mg via INTRAVENOUS
  Filled 2017-01-03: qty 2

## 2017-01-03 NOTE — ED Notes (Signed)
Patient given soup and a drink.

## 2017-01-03 NOTE — Discharge Instructions (Signed)
You were seen today for multiple things. Continue treatment for urinary tract infection. You'll be started on omeprazole for reflux and esophageal irritation likely secondary to vomiting. Takes Zofran as needed. Make sure to stay hydrated.  Follow up with your primary doctor for recheck in 1-2 days.

## 2017-01-03 NOTE — ED Provider Notes (Signed)
Patient signed out pending ultrasound of the gallbladder.  In brief this is a 66 year old female who presents with 2 weeks of malaise, decreased oral intake, vomiting, fever. She reports generally she doesn't feel well. Found to have a urinary tract infection. Currently on amoxicillin. There is some generalized abdominal pain, headache, dizziness. Patient's abdominal imaging is reassuring. On my recheck she continues to report headache. No neck pain or stiffness.  Symptoms could be related to recurrent UTI versus acute viral illness. Doubt meningitis. Patient was given a migraine cocktail to include Benadryl and Compazine. She continues to have aversion to drinking. Reports that her throat feels sore. This may be related to esophageal irritation from frequent vomiting. She reports improvement with a GI cocktail and is able to tolerate some fluids.  4:01 AM On recheck, she states that her headache is much better. She is tolerating fluids without difficulty. Recommend daily PPI for esophagitis. Recommend pushing hydration at home.  Patient and family stated understanding and agrees with plan.   Merryl Hacker, MD 01/03/17 [phone]

## 2017-01-04 DIAGNOSIS — R197 Diarrhea, unspecified: Secondary | ICD-10-CM | POA: Diagnosis not present

## 2017-01-04 DIAGNOSIS — R5383 Other fatigue: Secondary | ICD-10-CM | POA: Diagnosis not present

## 2017-01-04 DIAGNOSIS — G4452 New daily persistent headache (NDPH): Secondary | ICD-10-CM | POA: Diagnosis not present

## 2017-01-04 DIAGNOSIS — N3090 Cystitis, unspecified without hematuria: Secondary | ICD-10-CM | POA: Diagnosis not present

## 2017-01-04 DIAGNOSIS — E860 Dehydration: Secondary | ICD-10-CM | POA: Diagnosis not present

## 2017-01-04 LAB — URINE CULTURE: Culture: NO GROWTH

## 2017-01-07 LAB — CULTURE, BLOOD (ROUTINE X 2)
Culture: NO GROWTH
Special Requests: ADEQUATE

## 2017-01-09 DIAGNOSIS — H40013 Open angle with borderline findings, low risk, bilateral: Secondary | ICD-10-CM | POA: Diagnosis not present

## 2017-01-09 DIAGNOSIS — H40053 Ocular hypertension, bilateral: Secondary | ICD-10-CM | POA: Diagnosis not present

## 2017-01-09 DIAGNOSIS — H33032 Retinal detachment with giant retinal tear, left eye: Secondary | ICD-10-CM | POA: Diagnosis not present

## 2017-01-09 DIAGNOSIS — H3562 Retinal hemorrhage, left eye: Secondary | ICD-10-CM | POA: Diagnosis not present

## 2017-01-09 DIAGNOSIS — H4312 Vitreous hemorrhage, left eye: Secondary | ICD-10-CM | POA: Diagnosis not present

## 2017-01-23 DIAGNOSIS — H43813 Vitreous degeneration, bilateral: Secondary | ICD-10-CM | POA: Diagnosis not present

## 2017-01-23 DIAGNOSIS — H4313 Vitreous hemorrhage, bilateral: Secondary | ICD-10-CM | POA: Diagnosis not present

## 2017-01-23 DIAGNOSIS — H43392 Other vitreous opacities, left eye: Secondary | ICD-10-CM | POA: Diagnosis not present

## 2017-01-23 DIAGNOSIS — H3562 Retinal hemorrhage, left eye: Secondary | ICD-10-CM | POA: Diagnosis not present

## 2017-01-24 DIAGNOSIS — R197 Diarrhea, unspecified: Secondary | ICD-10-CM | POA: Diagnosis not present

## 2017-01-25 DIAGNOSIS — I129 Hypertensive chronic kidney disease with stage 1 through stage 4 chronic kidney disease, or unspecified chronic kidney disease: Secondary | ICD-10-CM | POA: Diagnosis not present

## 2017-01-25 DIAGNOSIS — D500 Iron deficiency anemia secondary to blood loss (chronic): Secondary | ICD-10-CM | POA: Diagnosis not present

## 2017-01-25 DIAGNOSIS — E1121 Type 2 diabetes mellitus with diabetic nephropathy: Secondary | ICD-10-CM | POA: Diagnosis not present

## 2017-01-25 DIAGNOSIS — E785 Hyperlipidemia, unspecified: Secondary | ICD-10-CM | POA: Diagnosis not present

## 2017-01-25 DIAGNOSIS — Z23 Encounter for immunization: Secondary | ICD-10-CM | POA: Diagnosis not present

## 2017-01-25 DIAGNOSIS — L989 Disorder of the skin and subcutaneous tissue, unspecified: Secondary | ICD-10-CM | POA: Diagnosis not present

## 2017-01-25 DIAGNOSIS — N182 Chronic kidney disease, stage 2 (mild): Secondary | ICD-10-CM | POA: Diagnosis not present

## 2017-01-25 DIAGNOSIS — N289 Disorder of kidney and ureter, unspecified: Secondary | ICD-10-CM | POA: Diagnosis not present

## 2017-01-25 DIAGNOSIS — Z136 Encounter for screening for cardiovascular disorders: Secondary | ICD-10-CM | POA: Diagnosis not present

## 2017-01-25 DIAGNOSIS — E559 Vitamin D deficiency, unspecified: Secondary | ICD-10-CM | POA: Diagnosis not present

## 2017-01-25 DIAGNOSIS — Z0000 Encounter for general adult medical examination without abnormal findings: Secondary | ICD-10-CM | POA: Diagnosis not present

## 2017-01-25 IMAGING — CT CT ABD-PELV W/ CM
2 of 5 series · 17 of 46 positions shown, 19 images · IV contrast (ISOVUE)
Comparison: 07/22/2015

CLINICAL DATA: Mid abdominal pain

EXAM:
CT ABDOMEN AND PELVIS WITH CONTRAST
TECHNIQUE: Multidetector CT imaging of the abdomen and pelvis was performed
using the standard protocol following bolus administration of
intravenous contrast.
CONTRAST:  100mL FJDUMM-Z00 IOPAMIDOL (FJDUMM-Z00) INJECTION 61%

[Series 2: abd/pel with · axial · 0.84mm/px · z∈[+647,+1047]mm · 14 of 94 slices shown, 16 images]
[im 7/94  soft-tissue]
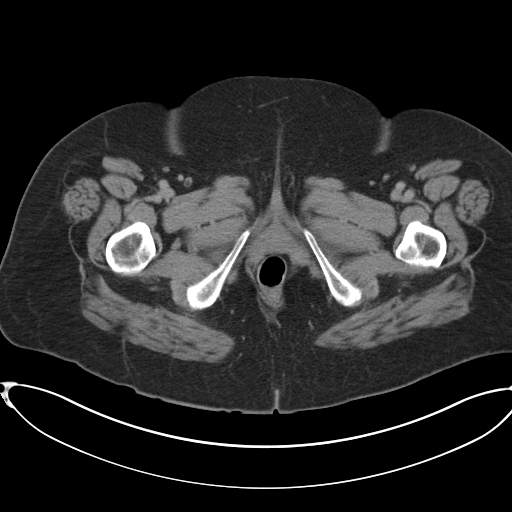
[im 7/94  bone]
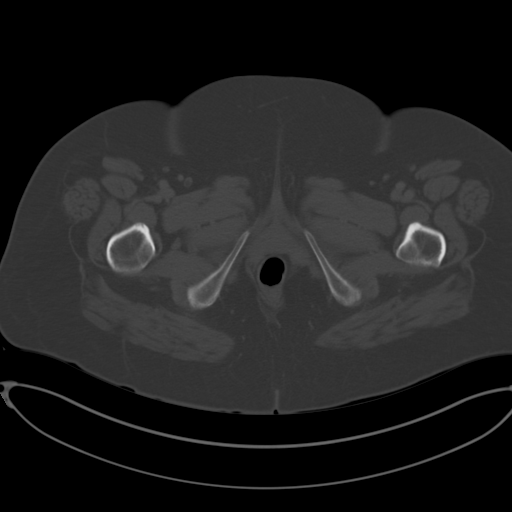
[im 13/94  soft-tissue]
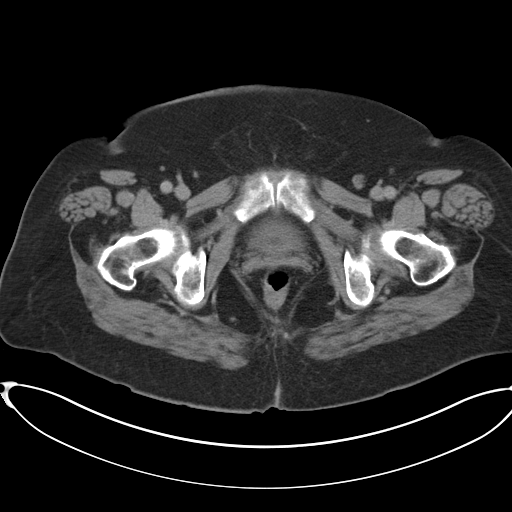
[im 19/94  soft-tissue]
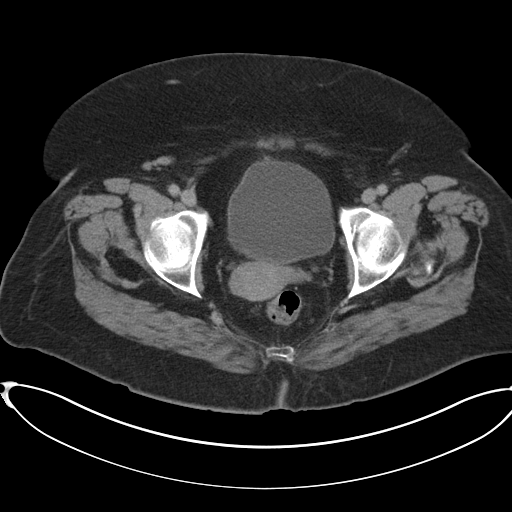
[im 25/94  soft-tissue]
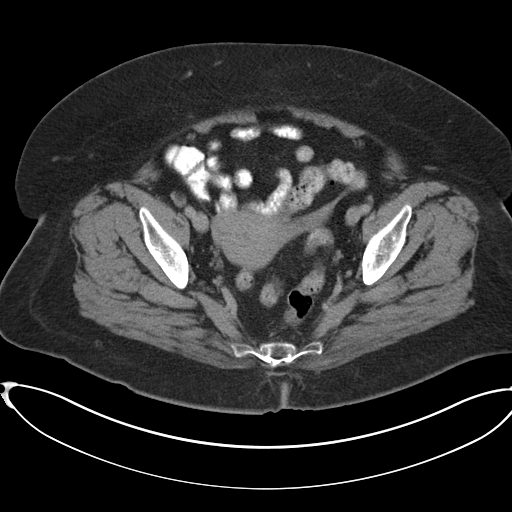
[im 32/94  soft-tissue]
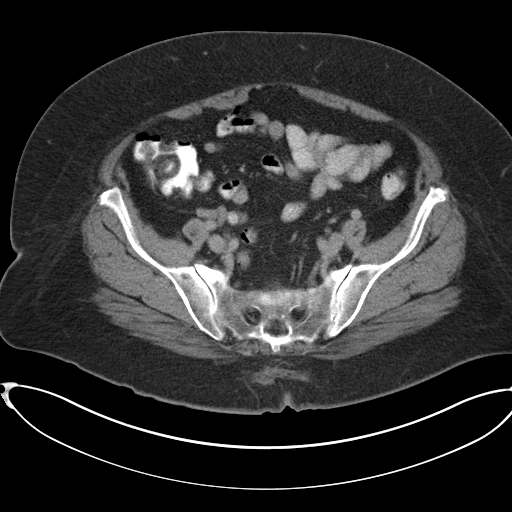
[im 38/94  soft-tissue]
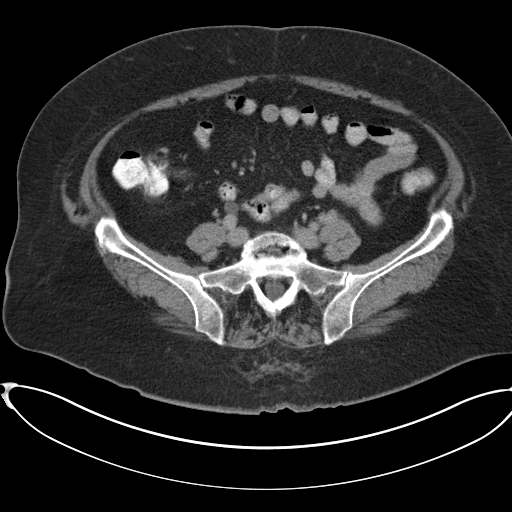
[im 44/94  soft-tissue]
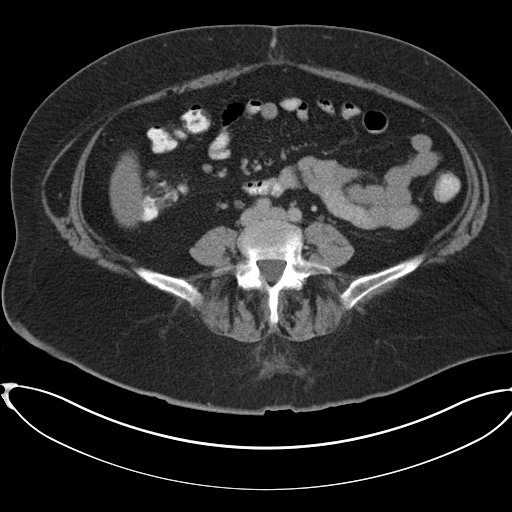
[im 50/94  soft-tissue]
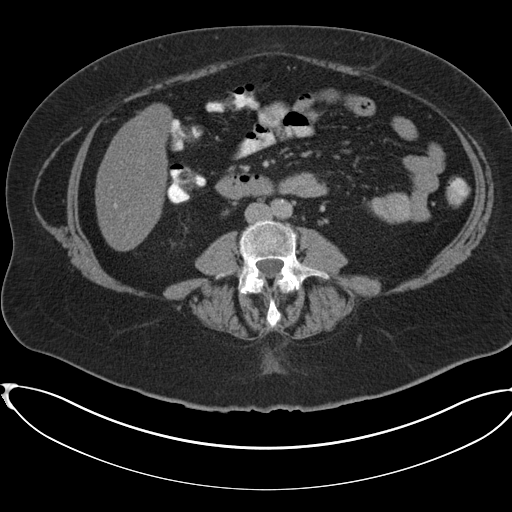
[im 56/94  soft-tissue]
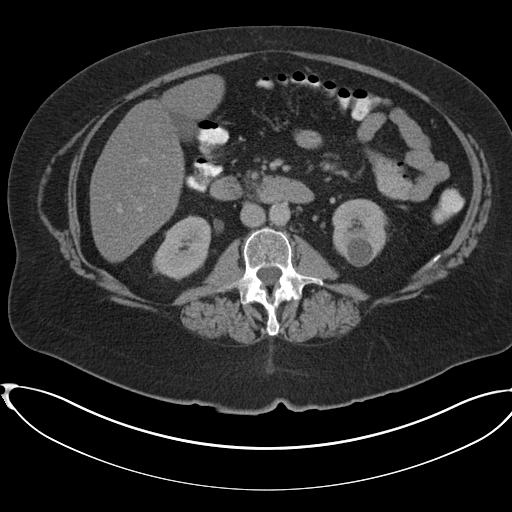
[im 56/94  bone]
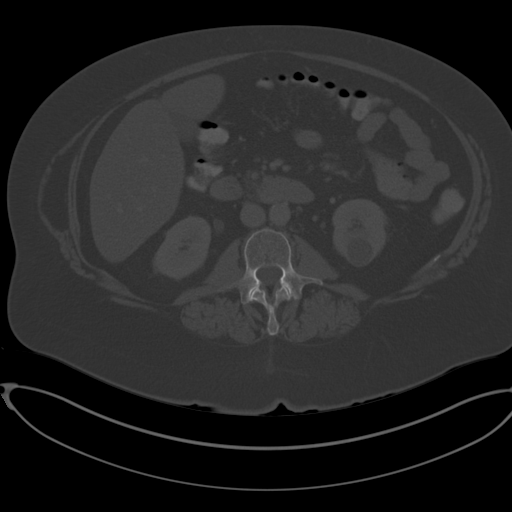
[im 63/94  soft-tissue]
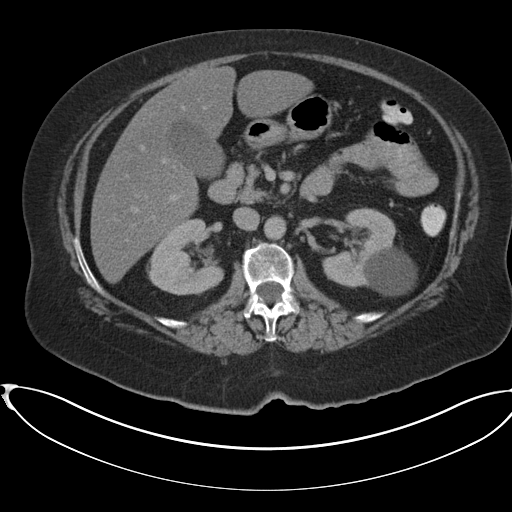
[im 69/94  soft-tissue]
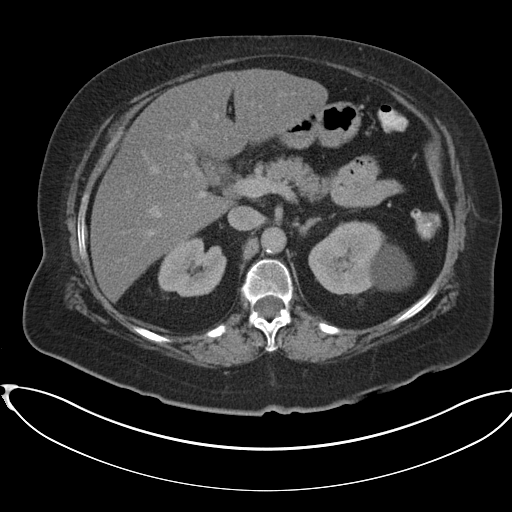
[im 75/94  soft-tissue]
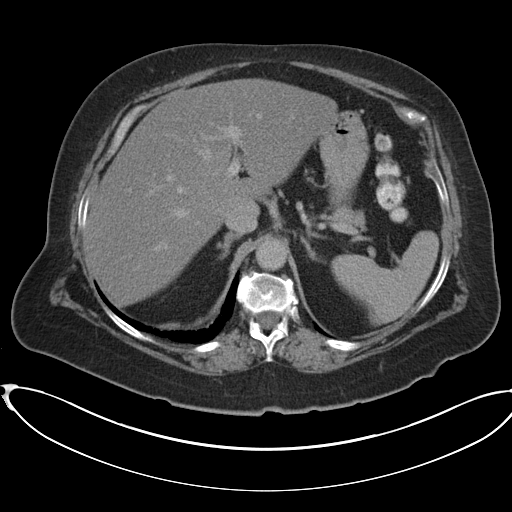
[im 81/94  soft-tissue]
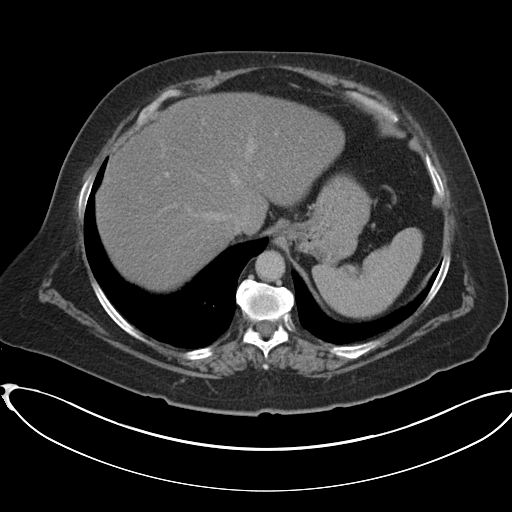
[im 87/94  soft-tissue]
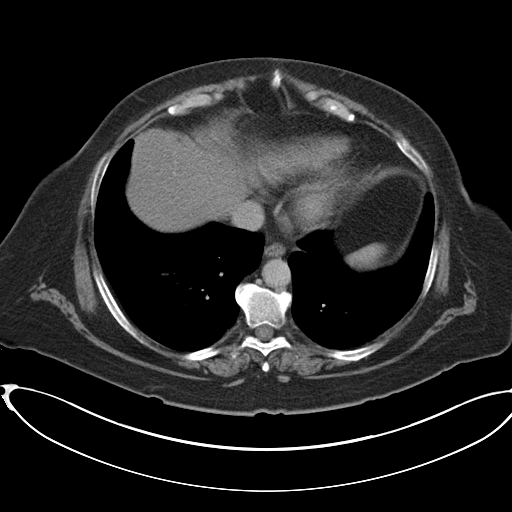

[Series 5: coronal a/|p · coronal · 0.77mm/px · 3 of 159 slices shown]
[im 53/159  soft-tissue]
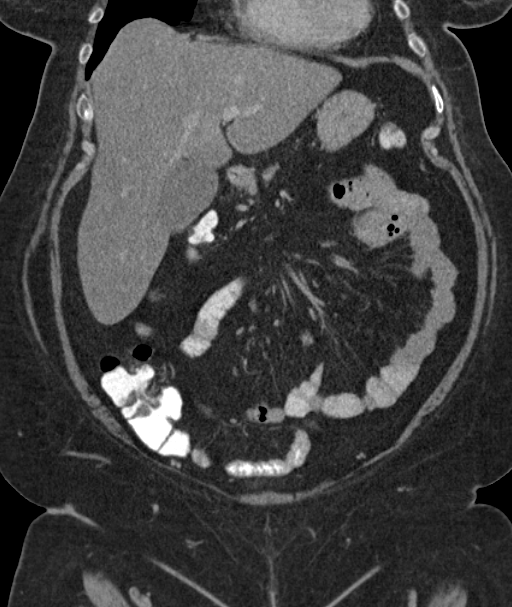
[im 71/159  soft-tissue]
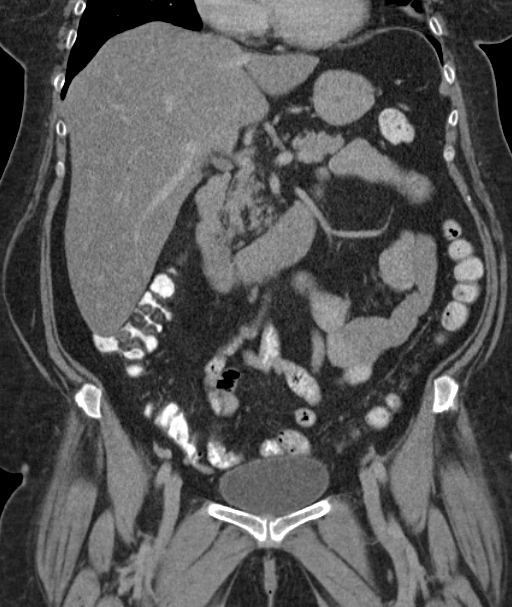
[im 88/159  soft-tissue]
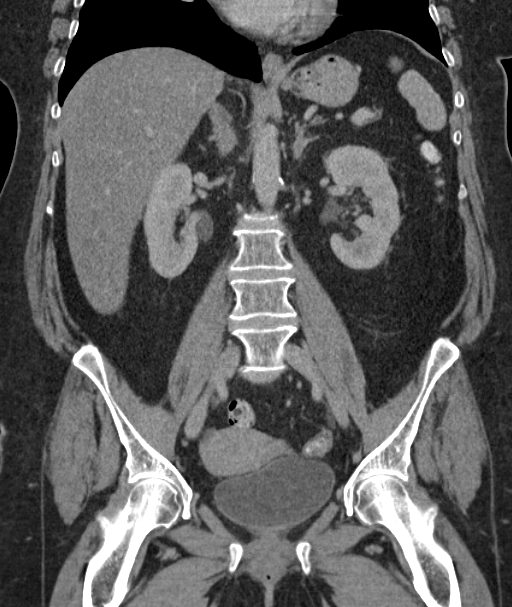

[17 of 46 positions shown; findings below may reference images not displayed]

FINDINGS: Diffuse hepatic steatosis.

Cholelithiasis.

Spleen, pancreas, adrenal glands are within normal limits.

Benign cysts in the left kidney.  Right kidney is unremarkable.

Normal appendix.

Bladder, uterus, and adnexa are unremarkable.

Sigmoid colon is within normal limits.

No evidence of small-bowel obstruction. No obvious mass in the
colon.

Atherosclerotic calcifications of the aorta.

No free-fluid.  No abnormal adenopathy by measurement criteria.

No vertebral compression deformity. Degenerative disc disease at
L5-S1 with vacuum.
IMPRESSION: Cholelithiasis.

## 2017-02-09 DIAGNOSIS — Z1231 Encounter for screening mammogram for malignant neoplasm of breast: Secondary | ICD-10-CM | POA: Diagnosis not present

## 2017-02-27 ENCOUNTER — Other Ambulatory Visit: Payer: Self-pay | Admitting: *Deleted

## 2017-02-27 DIAGNOSIS — Z23 Encounter for immunization: Secondary | ICD-10-CM | POA: Diagnosis not present

## 2017-02-27 DIAGNOSIS — D1800 Hemangioma unspecified site: Secondary | ICD-10-CM | POA: Diagnosis not present

## 2017-02-27 DIAGNOSIS — D485 Neoplasm of uncertain behavior of skin: Secondary | ICD-10-CM | POA: Diagnosis not present

## 2017-02-27 DIAGNOSIS — L820 Inflamed seborrheic keratosis: Secondary | ICD-10-CM | POA: Diagnosis not present

## 2017-02-27 DIAGNOSIS — L821 Other seborrheic keratosis: Secondary | ICD-10-CM | POA: Diagnosis not present

## 2017-02-27 DIAGNOSIS — Z808 Family history of malignant neoplasm of other organs or systems: Secondary | ICD-10-CM | POA: Diagnosis not present

## 2017-02-27 DIAGNOSIS — D225 Melanocytic nevi of trunk: Secondary | ICD-10-CM | POA: Diagnosis not present

## 2017-02-27 NOTE — Patient Outreach (Signed)
[place] [facility]) Care Management  02/27/2017  Alyssa Stevens 05-14-1950 [id number]   Health Risk Assessment call  Late Entry for 12/3 Unsuccessful call attempt to patient, able to leave a HIPAA compliant message.  Will plan return call within the week.  12/4 Unsuccessful call attempt to patient, no answer able to leave a HIPAA compliant message. Will plan next call within a week.   Joylene Draft, RN, [place] Management Coordinator  [phone]- Mobile [phone]- Toll Free Main Office

## 2017-02-28 ENCOUNTER — Other Ambulatory Visit: Payer: Self-pay | Admitting: *Deleted

## 2017-02-28 ENCOUNTER — Encounter: Payer: Self-pay | Admitting: *Deleted

## 2017-02-28 NOTE — Patient Outreach (Signed)
[place] [facility]) Care Management  02/28/2017  Alyssa Stevens [birth year] [id number]   Health Risk screening call  Successful outreach call to patient, explained reason for the call.  Patient discussed current medical condition of Diabetes, reports she checks blood sugar most days, and takes metformin daily. Patient discussed she has attended diabetes class. Patient discussed keeping regular visits with PCP , Dr.White. Patient declines need for further education needs at this time . Verbalized understanding low/high blood sugar, and importance of checking her feet.  Patient quit smoking in 2001. Patient denies in new concerns or questions at this time, patient is agreeable to receiving information on [facility] care management contact information.  Will send successful outreach letter with [facility] information.    Joylene Draft, RN, [place] Management Coordinator  [phone]- Mobile [phone]- Toll Free Main Office

## 2017-04-03 DIAGNOSIS — H43813 Vitreous degeneration, bilateral: Secondary | ICD-10-CM | POA: Diagnosis not present

## 2017-04-03 DIAGNOSIS — H4311 Vitreous hemorrhage, right eye: Secondary | ICD-10-CM | POA: Diagnosis not present

## 2017-04-03 DIAGNOSIS — H31091 Other chorioretinal scars, right eye: Secondary | ICD-10-CM | POA: Diagnosis not present

## 2017-04-03 DIAGNOSIS — H3562 Retinal hemorrhage, left eye: Secondary | ICD-10-CM | POA: Diagnosis not present

## 2017-08-16 DIAGNOSIS — E785 Hyperlipidemia, unspecified: Secondary | ICD-10-CM | POA: Diagnosis not present

## 2017-08-16 DIAGNOSIS — Z23 Encounter for immunization: Secondary | ICD-10-CM | POA: Diagnosis not present

## 2017-08-16 DIAGNOSIS — I129 Hypertensive chronic kidney disease with stage 1 through stage 4 chronic kidney disease, or unspecified chronic kidney disease: Secondary | ICD-10-CM | POA: Diagnosis not present

## 2017-08-16 DIAGNOSIS — E1121 Type 2 diabetes mellitus with diabetic nephropathy: Secondary | ICD-10-CM | POA: Diagnosis not present

## 2017-08-16 DIAGNOSIS — E559 Vitamin D deficiency, unspecified: Secondary | ICD-10-CM | POA: Diagnosis not present

## 2017-08-16 DIAGNOSIS — N182 Chronic kidney disease, stage 2 (mild): Secondary | ICD-10-CM | POA: Diagnosis not present

## 2017-11-20 DIAGNOSIS — E785 Hyperlipidemia, unspecified: Secondary | ICD-10-CM | POA: Diagnosis not present

## 2017-11-20 DIAGNOSIS — I129 Hypertensive chronic kidney disease with stage 1 through stage 4 chronic kidney disease, or unspecified chronic kidney disease: Secondary | ICD-10-CM | POA: Diagnosis not present

## 2017-11-20 DIAGNOSIS — E669 Obesity, unspecified: Secondary | ICD-10-CM | POA: Diagnosis not present

## 2017-11-20 DIAGNOSIS — E1121 Type 2 diabetes mellitus with diabetic nephropathy: Secondary | ICD-10-CM | POA: Diagnosis not present

## 2017-11-20 DIAGNOSIS — N182 Chronic kidney disease, stage 2 (mild): Secondary | ICD-10-CM | POA: Diagnosis not present

## 2017-11-20 DIAGNOSIS — Z23 Encounter for immunization: Secondary | ICD-10-CM | POA: Diagnosis not present

## 2017-11-20 DIAGNOSIS — E559 Vitamin D deficiency, unspecified: Secondary | ICD-10-CM | POA: Diagnosis not present

## 2018-02-18 DIAGNOSIS — Z1231 Encounter for screening mammogram for malignant neoplasm of breast: Secondary | ICD-10-CM | POA: Diagnosis not present

## 2018-02-26 DIAGNOSIS — E785 Hyperlipidemia, unspecified: Secondary | ICD-10-CM | POA: Diagnosis not present

## 2018-02-26 DIAGNOSIS — E1169 Type 2 diabetes mellitus with other specified complication: Secondary | ICD-10-CM | POA: Diagnosis not present

## 2018-02-26 DIAGNOSIS — E559 Vitamin D deficiency, unspecified: Secondary | ICD-10-CM | POA: Diagnosis not present

## 2018-02-26 DIAGNOSIS — I10 Essential (primary) hypertension: Secondary | ICD-10-CM | POA: Diagnosis not present

## 2018-04-24 DIAGNOSIS — K228 Other specified diseases of esophagus: Secondary | ICD-10-CM | POA: Diagnosis not present

## 2018-04-24 DIAGNOSIS — K317 Polyp of stomach and duodenum: Secondary | ICD-10-CM | POA: Diagnosis not present

## 2018-04-26 DIAGNOSIS — K228 Other specified diseases of esophagus: Secondary | ICD-10-CM | POA: Diagnosis not present

## 2018-05-01 ENCOUNTER — Other Ambulatory Visit (HOSPITAL_COMMUNITY)
Admission: RE | Admit: 2018-05-01 | Discharge: 2018-05-01 | Disposition: A | Discharge status: Home / Self Care | Payer: HMO | Source: Ambulatory Visit | Attending: Family Medicine | Admitting: Family Medicine

## 2018-05-01 ENCOUNTER — Other Ambulatory Visit: Payer: Self-pay | Admitting: Family Medicine

## 2018-05-01 DIAGNOSIS — E785 Hyperlipidemia, unspecified: Secondary | ICD-10-CM | POA: Diagnosis not present

## 2018-05-01 DIAGNOSIS — Z0000 Encounter for general adult medical examination without abnormal findings: Secondary | ICD-10-CM | POA: Insufficient documentation

## 2018-05-01 DIAGNOSIS — M8588 Other specified disorders of bone density and structure, other site: Secondary | ICD-10-CM | POA: Diagnosis not present

## 2018-05-01 DIAGNOSIS — M25552 Pain in left hip: Secondary | ICD-10-CM | POA: Diagnosis not present

## 2018-05-01 DIAGNOSIS — E559 Vitamin D deficiency, unspecified: Secondary | ICD-10-CM | POA: Diagnosis not present

## 2018-05-01 DIAGNOSIS — I10 Essential (primary) hypertension: Secondary | ICD-10-CM | POA: Diagnosis not present

## 2018-05-01 DIAGNOSIS — E1169 Type 2 diabetes mellitus with other specified complication: Secondary | ICD-10-CM | POA: Diagnosis not present

## 2018-05-01 DIAGNOSIS — H35033 Hypertensive retinopathy, bilateral: Secondary | ICD-10-CM | POA: Diagnosis not present

## 2018-05-02 LAB — CYTOLOGY - PAP: Diagnosis: NEGATIVE

## 2018-05-07 DIAGNOSIS — M8589 Other specified disorders of bone density and structure, multiple sites: Secondary | ICD-10-CM | POA: Diagnosis not present

## 2018-05-07 DIAGNOSIS — R634 Abnormal weight loss: Secondary | ICD-10-CM | POA: Diagnosis not present

## 2018-05-21 ENCOUNTER — Other Ambulatory Visit: Payer: Self-pay

## 2018-05-21 NOTE — Patient Outreach (Signed)
  [place] [facility]) Care Management Chronic Special Needs Program   05/21/2018  Name: LATEISHA THURLOW, DOB: [birth year]  MRN: [id number]  The client was discussed in an interdisciplinary care team meeting.  The following issues were discussed:  Client's needs, Key risk triggers/risk stratification, Care Plan, Coordination of care and Issues/barriers to care  Participants present: Mahlon Gammon, RNCM; Peter Garter, RNCM; Thea Silversmith, RNCM.  Recommendations:  Diabetes program; send educational material.  Plan:  Send copy of care plan to primary care provider; send copy of care plan to client, send educational material on self reported disease.  Follow-up:  In 2-4 months.  Thea Silversmith, RN, MSN, [place] [phone]

## 2018-07-08 ENCOUNTER — Other Ambulatory Visit: Payer: Self-pay

## 2018-07-08 NOTE — Patient Outreach (Signed)
  [place] [facility]) Care Management Chronic Special Needs Program  07/08/2018  Name: Alyssa Stevens DOB: Nov 02, 1950  MRN: [id number]  Alyssa Stevens is enrolled in a chronic special needs plan for Diabetes. Follow up call. Reviewed and updated care plan. Discussed COVID19 cause, symptoms, precautions (social distancing, stay at home order, hand washing), confirmed client knows how to contact provider.Reviewed and updated care plan.  Subjective: client reports she received [facility] information/care plan. She denies any questions or concerns. Client denies any questions or concerns regarding COVID-19.  Goals Addressed            This Visit's Progress   . Client understands the importance of follow-up with providers by attending scheduled visits   On track   . Client will use Assistive Devices as needed and verbalize understanding of device use   On track   . Client will verbalize knowledge of self management of Hypertension as evidences by BP reading of 140/90 or less; or as defined by provider   On track   . Maintain timely refills of diabetic medication as prescribed within the year .   On track   . Obtain annual  Lipid Profile, LDL-C   On track   . Obtain Annual Eye (retinal)  Exam    On track   . Obtain Annual Foot Exam   On track   . Obtain annual screen for micro albuminuria (urine) , nephropathy (kidney problems)   On track   . Obtain Hemoglobin A1C at least 2 times per year   On track   . Visit Primary Care Provider or Endocrinologist at least 2 times per year    On track     [facility] confirmed client has RNCM's contact number and encouraged to call as needed. RNCM also confirmed client knows how to contact health care concierge and 24 hour nurse advice line.  Plan: follow up outreach in the next 6-9 months.  Thea Silversmith, RN, MSN, [place] [phone]   .

## 2018-08-28 DIAGNOSIS — E785 Hyperlipidemia, unspecified: Secondary | ICD-10-CM | POA: Diagnosis not present

## 2018-08-28 DIAGNOSIS — I10 Essential (primary) hypertension: Secondary | ICD-10-CM | POA: Diagnosis not present

## 2018-08-28 DIAGNOSIS — E1169 Type 2 diabetes mellitus with other specified complication: Secondary | ICD-10-CM | POA: Diagnosis not present

## 2018-08-28 DIAGNOSIS — I700 Atherosclerosis of aorta: Secondary | ICD-10-CM | POA: Diagnosis not present

## 2018-08-28 DIAGNOSIS — E559 Vitamin D deficiency, unspecified: Secondary | ICD-10-CM | POA: Diagnosis not present

## 2018-09-06 DIAGNOSIS — D2272 Melanocytic nevi of left lower limb, including hip: Secondary | ICD-10-CM | POA: Diagnosis not present

## 2018-09-06 DIAGNOSIS — D235 Other benign neoplasm of skin of trunk: Secondary | ICD-10-CM | POA: Diagnosis not present

## 2018-09-06 DIAGNOSIS — L304 Erythema intertrigo: Secondary | ICD-10-CM | POA: Diagnosis not present

## 2018-09-06 DIAGNOSIS — D224 Melanocytic nevi of scalp and neck: Secondary | ICD-10-CM | POA: Diagnosis not present

## 2018-09-06 DIAGNOSIS — D225 Melanocytic nevi of trunk: Secondary | ICD-10-CM | POA: Diagnosis not present

## 2018-09-06 DIAGNOSIS — D692 Other nonthrombocytopenic purpura: Secondary | ICD-10-CM | POA: Diagnosis not present

## 2018-09-06 DIAGNOSIS — D485 Neoplasm of uncertain behavior of skin: Secondary | ICD-10-CM | POA: Diagnosis not present

## 2018-09-06 DIAGNOSIS — D1801 Hemangioma of skin and subcutaneous tissue: Secondary | ICD-10-CM | POA: Diagnosis not present

## 2018-09-06 DIAGNOSIS — D2271 Melanocytic nevi of right lower limb, including hip: Secondary | ICD-10-CM | POA: Diagnosis not present

## 2018-09-06 DIAGNOSIS — L814 Other melanin hyperpigmentation: Secondary | ICD-10-CM | POA: Diagnosis not present

## 2018-09-06 DIAGNOSIS — D2262 Melanocytic nevi of left upper limb, including shoulder: Secondary | ICD-10-CM | POA: Diagnosis not present

## 2018-09-06 DIAGNOSIS — L550 Sunburn of first degree: Secondary | ICD-10-CM | POA: Diagnosis not present

## 2018-12-17 DIAGNOSIS — I10 Essential (primary) hypertension: Secondary | ICD-10-CM | POA: Diagnosis not present

## 2018-12-17 DIAGNOSIS — E785 Hyperlipidemia, unspecified: Secondary | ICD-10-CM | POA: Diagnosis not present

## 2018-12-17 DIAGNOSIS — E1169 Type 2 diabetes mellitus with other specified complication: Secondary | ICD-10-CM | POA: Diagnosis not present

## 2018-12-17 DIAGNOSIS — H35033 Hypertensive retinopathy, bilateral: Secondary | ICD-10-CM | POA: Diagnosis not present

## 2018-12-17 DIAGNOSIS — F324 Major depressive disorder, single episode, in partial remission: Secondary | ICD-10-CM | POA: Diagnosis not present

## 2019-01-27 ENCOUNTER — Ambulatory Visit: Payer: Self-pay

## 2019-02-28 DIAGNOSIS — Z1231 Encounter for screening mammogram for malignant neoplasm of breast: Secondary | ICD-10-CM | POA: Diagnosis not present

## 2019-04-01 ENCOUNTER — Ambulatory Visit: Payer: Self-pay

## 2019-04-14 ENCOUNTER — Ambulatory Visit: Payer: Self-pay

## 2019-04-16 ENCOUNTER — Other Ambulatory Visit: Payer: Self-pay

## 2019-04-16 NOTE — Patient Outreach (Signed)
  [place] [facility]) Care Management Chronic Special Needs Program    04/16/2019  Name: MANINDER ESTEVE, DOB: 03-Nov-1950  MRN: [id number]   Ms. Jeziah Blackbear is enrolled in a chronic special needs plan for Diabetes. RNCM called to obtain health risk assessment and update individualized care plan. Client answered, confirm two client indenfiers. She reports this is not a good time to talk and request visit be rescheduled.  Plan: RNCM will follow up call tomorrow.  Thea Silversmith, RN, MSN, [place] [phone]

## 2019-04-17 ENCOUNTER — Other Ambulatory Visit: Payer: Self-pay

## 2019-04-17 NOTE — Patient Outreach (Signed)
  [place]) Care Management Chronic Special Needs Program    04/17/2019  Name: JULICIA SEO, DOB: December 31, 1950  MRN: [id number]   Ms. Nadene Dahlen is enrolled in a chronic special needs plan for Diabetes. RNCM called to complete health risk assessment and update individualized care plan. No answer. HIPPA compliant message left. 2nd outreach attempt.  Plan: RNCM will outreach within the next 2-3 weeks.  Thea Silversmith, RN, MSN, [place] [phone]

## 2019-04-23 ENCOUNTER — Other Ambulatory Visit: Payer: Self-pay

## 2019-04-23 NOTE — Patient Outreach (Addendum)
  [place] [facility]) Care Management Chronic Special Needs Program  04/23/2019  Name: Alyssa Stevens DOB: 10-11-1950  MRN: [id number]  Ms. Alyssa Stevens is enrolled in a chronic special needs plan for Diabetes. RNCM called to follow up; assist with completing health risk assessment and update care plan. No answer. HIPPA compliant message left. 3rd outreach attempt. Per policy and procedure, care plan updated based upon available data.   Goals Addressed            This Visit's Progress   . COMPLETED: Client understands the importance of follow-up with providers by attending scheduled visits   On track    No data available    . COMPLETED: Client will use Assistive Devices as needed and verbalize understanding of device use   On track    No data available    . Client will verbalize knowledge of self management of Hypertension as evidences by BP reading of 140/90 or less; or as defined by provider   On track    No data available.  Review educational material and call if you have any questions. Follow up with your doctor as scheduled. Ask you doctor what is the target range for your blood pressure. Periodically take and record your blood pressure readings and take to your doctor at next visit. Continue to take your medications as recommended by your provider.    Marland Kitchen HEMOGLOBIN A1C < 7.0       Continue goal(2021)  Diabetes self management actions:  Glucose monitoring per provider recommendations  Eat Healthy  Check feet daily  Visit provider every 3-6 months as directed  Hbg A1C level every 3-6 months.  Eye Exam yearly    . Maintain timely refills of diabetic medication as prescribed within the year .   On track    No data available    . Obtain annual  Lipid Profile, LDL-C   On track    No data available    . Obtain Annual Eye (retinal)  Exam    On track    No data available    . Obtain Annual Foot Exam   On track    No data available    . Obtain  annual screen for micro albuminuria (urine) , nephropathy (kidney problems)   On track    No data available    . Obtain Hemoglobin A1C at least 2 times per year   On track    No data available    . Visit Primary Care Provider or Endocrinologist at least 2 times per year    On track    No data available. Please ensure you have an annual wellness exam scheduled for year 2021.       Plan: RNCM will outreach per tier level within the next 9-12 months.    Thea Silversmith, RN, MSN, [place] [phone]   .

## 2019-05-07 DIAGNOSIS — Z0000 Encounter for general adult medical examination without abnormal findings: Secondary | ICD-10-CM | POA: Diagnosis not present

## 2019-05-07 DIAGNOSIS — E785 Hyperlipidemia, unspecified: Secondary | ICD-10-CM | POA: Diagnosis not present

## 2019-05-07 DIAGNOSIS — I10 Essential (primary) hypertension: Secondary | ICD-10-CM | POA: Diagnosis not present

## 2019-05-07 DIAGNOSIS — E1169 Type 2 diabetes mellitus with other specified complication: Secondary | ICD-10-CM | POA: Diagnosis not present

## 2019-05-07 DIAGNOSIS — I700 Atherosclerosis of aorta: Secondary | ICD-10-CM | POA: Diagnosis not present

## 2019-05-07 DIAGNOSIS — E559 Vitamin D deficiency, unspecified: Secondary | ICD-10-CM | POA: Diagnosis not present

## 2019-05-07 DIAGNOSIS — Z1389 Encounter for screening for other disorder: Secondary | ICD-10-CM | POA: Diagnosis not present

## 2019-07-01 DIAGNOSIS — H40013 Open angle with borderline findings, low risk, bilateral: Secondary | ICD-10-CM | POA: Diagnosis not present

## 2019-07-01 DIAGNOSIS — H2513 Age-related nuclear cataract, bilateral: Secondary | ICD-10-CM | POA: Diagnosis not present

## 2019-07-01 DIAGNOSIS — H40053 Ocular hypertension, bilateral: Secondary | ICD-10-CM | POA: Diagnosis not present

## 2019-07-01 DIAGNOSIS — H43813 Vitreous degeneration, bilateral: Secondary | ICD-10-CM | POA: Diagnosis not present

## 2019-07-01 DIAGNOSIS — H524 Presbyopia: Secondary | ICD-10-CM | POA: Diagnosis not present

## 2019-09-12 DIAGNOSIS — D2261 Melanocytic nevi of right upper limb, including shoulder: Secondary | ICD-10-CM | POA: Diagnosis not present

## 2019-09-12 DIAGNOSIS — D1801 Hemangioma of skin and subcutaneous tissue: Secondary | ICD-10-CM | POA: Diagnosis not present

## 2019-09-12 DIAGNOSIS — D2271 Melanocytic nevi of right lower limb, including hip: Secondary | ICD-10-CM | POA: Diagnosis not present

## 2019-09-12 DIAGNOSIS — D692 Other nonthrombocytopenic purpura: Secondary | ICD-10-CM | POA: Diagnosis not present

## 2019-09-12 DIAGNOSIS — D2262 Melanocytic nevi of left upper limb, including shoulder: Secondary | ICD-10-CM | POA: Diagnosis not present

## 2019-09-12 DIAGNOSIS — D2272 Melanocytic nevi of left lower limb, including hip: Secondary | ICD-10-CM | POA: Diagnosis not present

## 2019-09-12 DIAGNOSIS — D225 Melanocytic nevi of trunk: Secondary | ICD-10-CM | POA: Diagnosis not present

## 2019-09-12 DIAGNOSIS — L814 Other melanin hyperpigmentation: Secondary | ICD-10-CM | POA: Diagnosis not present

## 2019-11-19 DIAGNOSIS — D179 Benign lipomatous neoplasm, unspecified: Secondary | ICD-10-CM | POA: Diagnosis not present

## 2019-11-19 DIAGNOSIS — E1169 Type 2 diabetes mellitus with other specified complication: Secondary | ICD-10-CM | POA: Diagnosis not present

## 2019-11-19 DIAGNOSIS — E559 Vitamin D deficiency, unspecified: Secondary | ICD-10-CM | POA: Diagnosis not present

## 2019-11-19 DIAGNOSIS — Z23 Encounter for immunization: Secondary | ICD-10-CM | POA: Diagnosis not present

## 2019-11-19 DIAGNOSIS — E785 Hyperlipidemia, unspecified: Secondary | ICD-10-CM | POA: Diagnosis not present

## 2019-11-19 DIAGNOSIS — I10 Essential (primary) hypertension: Secondary | ICD-10-CM | POA: Diagnosis not present

## 2019-12-12 DIAGNOSIS — R05 Cough: Secondary | ICD-10-CM | POA: Diagnosis not present

## 2019-12-12 DIAGNOSIS — R0989 Other specified symptoms and signs involving the circulatory and respiratory systems: Secondary | ICD-10-CM | POA: Diagnosis not present

## 2019-12-12 DIAGNOSIS — E119 Type 2 diabetes mellitus without complications: Secondary | ICD-10-CM | POA: Diagnosis not present

## 2019-12-12 DIAGNOSIS — U071 COVID-19: Secondary | ICD-10-CM | POA: Diagnosis not present

## 2020-01-01 ENCOUNTER — Other Ambulatory Visit: Payer: Self-pay

## 2020-01-01 NOTE — Patient Outreach (Addendum)
  [place] [facility]) Care Management Chronic Special Needs Program  01/01/2020  Name: JOWANDA HEEG DOB: 15-Feb-1951  MRN: [id number]  Ms. Paraskevi Funez is enrolled in a chronic special needs plan for Diabetes. RNCM reviewed and updated care plan.  Subjective:client reports she is doing ok. Client reports she attends provider visits as scheduled. She states she has all her medications and has no issues with medication management. Last A1C 6.5. client denies any questions or concern. No care management needs identified at this time.  Goals Addressed            This Visit's Progress   . COMPLETED: Client will verbalize knowledge of self management of Hypertension as evidences by BP reading of 140/90 or less; or as defined by provider       Discussed blood pressure management with client. Client reports blood pressures less than 140/90.  Last documented blood pressure 125/78 on 11/19/19.  Continue blood pressure self-management actions: Follow up with your doctor as scheduled. Periodically take and record your blood pressure readings and take to your doctor at next visit. Continue to take your medications as recommended by your provider. Attend provider visits as scheduled. Eat healthy: low salt and heart healthy meals with fruits, vegetables, whole grains, lean protein and limit fat and sugars.     Marland Kitchen HEMOGLOBIN A1C < 7       A1C 6.5 on 11/19/19. Great Job!  Continue Diabetes self management actions:  Glucose monitoring per provider recommendations  Eat Healthy: low carbohydrate and low salt meals, watch portion sizes and avoid sugar sweetened drinks.   Visit provider every 3-6 months as directed  Hbg A1C level every 3-6 months.  Take medications as prescribed   Attend provider visits as recommended.    . COMPLETED: Maintain timely refills of diabetic medication as prescribed within the year .       Client denies any difficulty obtaining medications.    .  COMPLETED: Obtain annual  Lipid Profile, LDL-C       Completed 11/19/2019    . COMPLETED: Obtain Annual Eye (retinal)  Exam        Done 07/01/2019    . COMPLETED: Obtain Annual Foot Exam       Done 11/19/2019    . COMPLETED: Obtain annual screen for micro albuminuria (urine) , nephropathy (kidney problems)       Done 11/19/2019    . COMPLETED: Obtain Hemoglobin A1C at least 2 times per year       Reports done twice a year latest A1C 6.5 on 11/19/19    . COMPLETED: Visit Primary Care Provider or Endocrinologist at least 2 times per year        Client reports attends provider visit at least twice per year. Last visit completed 11/2019      Plan: Send updated care plan to client; send updated care plan to primary care provider. Next outreach per tier level within the next 9-12 months.  Thea Silversmith, RN, MSN, [place] [phone]

## 2020-01-06 DIAGNOSIS — H43813 Vitreous degeneration, bilateral: Secondary | ICD-10-CM | POA: Diagnosis not present

## 2020-01-06 DIAGNOSIS — H2513 Age-related nuclear cataract, bilateral: Secondary | ICD-10-CM | POA: Diagnosis not present

## 2020-01-06 DIAGNOSIS — H40013 Open angle with borderline findings, low risk, bilateral: Secondary | ICD-10-CM | POA: Diagnosis not present

## 2020-01-06 DIAGNOSIS — H40053 Ocular hypertension, bilateral: Secondary | ICD-10-CM | POA: Diagnosis not present

## 2020-02-24 ENCOUNTER — Other Ambulatory Visit: Payer: Self-pay

## 2020-02-24 NOTE — Patient Outreach (Signed)
  [place] [facility]) Care Management Chronic Special Needs Program    02/24/2020  Name: Alyssa Stevens, DOB: 1950/10/14  MRN: [id number]   Ms. Alyssa Stevens is enrolled in a chronic special needs plan for Diabetes. [place] Management will continue to provide services for this member through 03/26/2020. [facility] Care Management Team will assume care 03/27/2020.   Thea Silversmith, RN, MSN, [place] [phone]

## 2020-03-10 DIAGNOSIS — Z1231 Encounter for screening mammogram for malignant neoplasm of breast: Secondary | ICD-10-CM | POA: Diagnosis not present

## 2020-04-02 ENCOUNTER — Other Ambulatory Visit: Payer: Self-pay

## 2020-04-09 DIAGNOSIS — L659 Nonscarring hair loss, unspecified: Secondary | ICD-10-CM | POA: Diagnosis not present

## 2020-04-09 DIAGNOSIS — R946 Abnormal results of thyroid function studies: Secondary | ICD-10-CM | POA: Diagnosis not present

## 2020-04-09 DIAGNOSIS — E1169 Type 2 diabetes mellitus with other specified complication: Secondary | ICD-10-CM | POA: Diagnosis not present

## 2020-04-09 DIAGNOSIS — Z79899 Other long term (current) drug therapy: Secondary | ICD-10-CM | POA: Diagnosis not present

## 2020-04-09 DIAGNOSIS — Z1159 Encounter for screening for other viral diseases: Secondary | ICD-10-CM | POA: Diagnosis not present

## 2020-04-23 DIAGNOSIS — R946 Abnormal results of thyroid function studies: Secondary | ICD-10-CM | POA: Diagnosis not present

## 2020-05-24 DIAGNOSIS — I10 Essential (primary) hypertension: Secondary | ICD-10-CM | POA: Diagnosis not present

## 2020-05-24 DIAGNOSIS — E559 Vitamin D deficiency, unspecified: Secondary | ICD-10-CM | POA: Diagnosis not present

## 2020-05-24 DIAGNOSIS — M65331 Trigger finger, right middle finger: Secondary | ICD-10-CM | POA: Diagnosis not present

## 2020-05-24 DIAGNOSIS — K219 Gastro-esophageal reflux disease without esophagitis: Secondary | ICD-10-CM | POA: Diagnosis not present

## 2020-05-24 DIAGNOSIS — D171 Benign lipomatous neoplasm of skin and subcutaneous tissue of trunk: Secondary | ICD-10-CM | POA: Diagnosis not present

## 2020-05-24 DIAGNOSIS — I700 Atherosclerosis of aorta: Secondary | ICD-10-CM | POA: Diagnosis not present

## 2020-05-24 DIAGNOSIS — Z0000 Encounter for general adult medical examination without abnormal findings: Secondary | ICD-10-CM | POA: Diagnosis not present

## 2020-05-24 DIAGNOSIS — E785 Hyperlipidemia, unspecified: Secondary | ICD-10-CM | POA: Diagnosis not present

## 2020-05-24 DIAGNOSIS — F324 Major depressive disorder, single episode, in partial remission: Secondary | ICD-10-CM | POA: Diagnosis not present

## 2020-05-24 DIAGNOSIS — E1169 Type 2 diabetes mellitus with other specified complication: Secondary | ICD-10-CM | POA: Diagnosis not present

## 2020-05-24 DIAGNOSIS — M8588 Other specified disorders of bone density and structure, other site: Secondary | ICD-10-CM | POA: Diagnosis not present

## 2020-06-24 DIAGNOSIS — M65331 Trigger finger, right middle finger: Secondary | ICD-10-CM | POA: Diagnosis not present

## 2020-06-24 DIAGNOSIS — Z780 Asymptomatic menopausal state: Secondary | ICD-10-CM | POA: Diagnosis not present

## 2020-06-24 DIAGNOSIS — M8589 Other specified disorders of bone density and structure, multiple sites: Secondary | ICD-10-CM | POA: Diagnosis not present

## 2020-07-22 DIAGNOSIS — M65331 Trigger finger, right middle finger: Secondary | ICD-10-CM | POA: Diagnosis not present

## 2020-07-27 DIAGNOSIS — H40013 Open angle with borderline findings, low risk, bilateral: Secondary | ICD-10-CM | POA: Diagnosis not present

## 2020-07-27 DIAGNOSIS — H52223 Regular astigmatism, bilateral: Secondary | ICD-10-CM | POA: Diagnosis not present

## 2020-07-27 DIAGNOSIS — E119 Type 2 diabetes mellitus without complications: Secondary | ICD-10-CM | POA: Diagnosis not present

## 2020-07-27 DIAGNOSIS — H2513 Age-related nuclear cataract, bilateral: Secondary | ICD-10-CM | POA: Diagnosis not present

## 2020-07-27 DIAGNOSIS — H40053 Ocular hypertension, bilateral: Secondary | ICD-10-CM | POA: Diagnosis not present

## 2020-07-27 DIAGNOSIS — H524 Presbyopia: Secondary | ICD-10-CM | POA: Diagnosis not present

## 2020-09-30 DIAGNOSIS — L814 Other melanin hyperpigmentation: Secondary | ICD-10-CM | POA: Diagnosis not present

## 2020-09-30 DIAGNOSIS — D171 Benign lipomatous neoplasm of skin and subcutaneous tissue of trunk: Secondary | ICD-10-CM | POA: Diagnosis not present

## 2020-09-30 DIAGNOSIS — D2261 Melanocytic nevi of right upper limb, including shoulder: Secondary | ICD-10-CM | POA: Diagnosis not present

## 2020-09-30 DIAGNOSIS — D2372 Other benign neoplasm of skin of left lower limb, including hip: Secondary | ICD-10-CM | POA: Diagnosis not present

## 2020-09-30 DIAGNOSIS — D225 Melanocytic nevi of trunk: Secondary | ICD-10-CM | POA: Diagnosis not present

## 2020-09-30 DIAGNOSIS — D1801 Hemangioma of skin and subcutaneous tissue: Secondary | ICD-10-CM | POA: Diagnosis not present

## 2020-09-30 DIAGNOSIS — D2272 Melanocytic nevi of left lower limb, including hip: Secondary | ICD-10-CM | POA: Diagnosis not present

## 2020-09-30 DIAGNOSIS — D2262 Melanocytic nevi of left upper limb, including shoulder: Secondary | ICD-10-CM | POA: Diagnosis not present

## 2020-09-30 DIAGNOSIS — D2271 Melanocytic nevi of right lower limb, including hip: Secondary | ICD-10-CM | POA: Diagnosis not present

## 2020-09-30 DIAGNOSIS — D1723 Benign lipomatous neoplasm of skin and subcutaneous tissue of right leg: Secondary | ICD-10-CM | POA: Diagnosis not present

## 2020-09-30 DIAGNOSIS — L570 Actinic keratosis: Secondary | ICD-10-CM | POA: Diagnosis not present

## 2020-09-30 DIAGNOSIS — D235 Other benign neoplasm of skin of trunk: Secondary | ICD-10-CM | POA: Diagnosis not present

## 2020-12-08 DIAGNOSIS — Z23 Encounter for immunization: Secondary | ICD-10-CM | POA: Diagnosis not present

## 2020-12-08 DIAGNOSIS — E538 Deficiency of other specified B group vitamins: Secondary | ICD-10-CM | POA: Diagnosis not present

## 2020-12-08 DIAGNOSIS — R946 Abnormal results of thyroid function studies: Secondary | ICD-10-CM | POA: Diagnosis not present

## 2020-12-08 DIAGNOSIS — E559 Vitamin D deficiency, unspecified: Secondary | ICD-10-CM | POA: Diagnosis not present

## 2020-12-08 DIAGNOSIS — E1169 Type 2 diabetes mellitus with other specified complication: Secondary | ICD-10-CM | POA: Diagnosis not present

## 2020-12-08 DIAGNOSIS — Z7984 Long term (current) use of oral hypoglycemic drugs: Secondary | ICD-10-CM | POA: Diagnosis not present

## 2020-12-08 DIAGNOSIS — E785 Hyperlipidemia, unspecified: Secondary | ICD-10-CM | POA: Diagnosis not present

## 2020-12-08 DIAGNOSIS — I10 Essential (primary) hypertension: Secondary | ICD-10-CM | POA: Diagnosis not present

## 2020-12-20 ENCOUNTER — Ambulatory Visit
Admission: RE | Admit: 2020-12-20 | Discharge: 2020-12-20 | Disposition: A | Discharge status: Home / Self Care | Payer: HMO | Source: Ambulatory Visit | Attending: Urgent Care | Admitting: Urgent Care

## 2020-12-20 ENCOUNTER — Other Ambulatory Visit: Payer: Self-pay

## 2020-12-20 VITALS — BP 171/79 | HR 108 | Temp 98.4°F | Resp 16

## 2020-12-20 DIAGNOSIS — L255 Unspecified contact dermatitis due to plants, except food: Secondary | ICD-10-CM

## 2020-12-20 MED ORDER — PREDNISONE 20 MG PO TABS: ORAL_TABLET | ORAL | 0 refills | Status: AC

## 2020-12-20 MED ORDER — HYDROXYZINE HCL 25 MG PO TABS: 12.5000 mg | ORAL_TABLET | Freq: Three times a day (TID) | ORAL | 0 refills | Status: AC | PRN

## 2020-12-20 NOTE — ED Triage Notes (Signed)
Itching rash patient believes may be poison ivy/oak starting around 10 days prior after working outside in her yard.

## 2020-12-20 NOTE — ED Provider Notes (Signed)
[place]   MRN: [id number] DOB: Oct 10, 1950  Subjective:   Alyssa Stevens is a 70 y.o. female presenting for 10-day history of persistent rash over her arms, hands and lower legs.  She did have a spot on the left side of her face that has improved.  Has been using over-the-counter medications, cortisone with minimal relief.  Symptoms started after she did yard work with her daughter-in-law.  States that she had a very similar rash.  Does not lives with her.  No other people that lives with her in the home have a similar rash.  Denies fever, tenderness, drainage of pus or bleeding, chest pain, shortness of breath, wheezing, oral or facial swelling.  No genital involvement.  No other new exposures that she can think of.  Patient has type 2 diabetes but is well controlled without insulin.  No current facility-administered medications for this encounter.  Current Outpatient Medications:    amoxicillin-clavulanate (AUGMENTIN) 875-125 MG tablet, Take 1 tablet by mouth 2 (two) times daily.  (Patient not taking: Reported on 01/01/2020), Disp: , Rfl:    Calcium Carb-Cholecalciferol (CALCIUM 600+D3 PO), Take 1 tablet by mouth daily., Disp: , Rfl:    cephALEXin (KEFLEX) 500 MG capsule, Take 1 capsule (500 mg total) by mouth 4 (four) times daily. (Patient not taking: Reported on 07/08/2018), Disp: 28 capsule, Rfl: 0   cholecalciferol (VITAMIN D3) 25 MCG (1000 UT) tablet, Take 2,000 Units by mouth daily., Disp: , Rfl:    iron polysaccharides (FERREX 150) 150 MG capsule, Take 150 mg by mouth daily. (Patient not taking: Reported on 01/01/2020), Disp: , Rfl:    lisinopril (ZESTRIL) 10 MG tablet, Take 10 mg by mouth daily., Disp: , Rfl:    lisinopril-hydrochlorothiazide (PRINZIDE,ZESTORETIC) 20-25 MG tablet, Take 1 tablet by mouth daily. (Patient not taking: Reported on 01/01/2020), Disp: , Rfl:    metFORMIN (GLUMETZA) 500 MG (MOD) 24 hr tablet, Take 2,000 mg by mouth at bedtime., Disp: , Rfl:     naproxen sodium (ANAPROX) 220 MG tablet, Take 220 mg by mouth every 12 (twelve) hours as needed (for pain). (Patient not taking: Reported on 01/01/2020), Disp: , Rfl:    omeprazole (PRILOSEC) 20 MG capsule, Take 1 capsule (20 mg total) by mouth daily. (Patient not taking: Reported on 07/08/2018), Disp: 30 capsule, Rfl: 0   omeprazole (PRILOSEC) 20 MG capsule, Take 1 capsule (20 mg total) by mouth daily. (Patient not taking: Reported on 07/08/2018), Disp: 30 capsule, Rfl: 0   ondansetron (ZOFRAN ODT) 4 MG disintegrating tablet, 4mg  ODT q4 hours prn nausea/vomit (Patient not taking: Reported on 07/08/2018), Disp: 12 tablet, Rfl: 0   pravastatin (PRAVACHOL) 20 MG tablet, Take 20 mg by mouth daily., Disp: , Rfl:    Semaglutide (OZEMPIC, 0.25 OR 0.5 MG/DOSE, [facility]), Inject 0.5 mg into the skin once a week., Disp: , Rfl:    traMADol (ULTRAM) 50 MG tablet, Take one every 8 hours for pain not helped by tylenol or motrin (Patient not taking: Reported on 07/08/2018), Disp: 10 tablet, Rfl: 0   Allergies  Allergen Reactions   Trulicity [Dulaglutide] Nausea And Vomiting   Jardiance [Empagliflozin] Other (See Comments)    urinary tract infection symptoms    Past Medical History:  Diagnosis Date   Anemia    Anemia    Diabetes mellitus without complication ([place])    Hypercholesterolemia    Hypertension      Past Surgical History:  Procedure Laterality Date   COLONOSCOPY WITH PROPOFOL N/A 09/15/2015  Procedure: COLONOSCOPY WITH PROPOFOL;  Surgeon: Clarene Essex, MD;  Location: [facility] ENDOSCOPY;  Service: Endoscopy;  Laterality: N/A;   FRACTURE SURGERY     left leg   HOT HEMOSTASIS N/A 09/15/2015   Procedure: HOT HEMOSTASIS (ARGON PLASMA COAGULATION/BICAP);  Surgeon: Clarene Essex, MD;  Location: [facility] ENDOSCOPY;  Service: Endoscopy;  Laterality: N/A;    Family History  Problem Relation Age of Onset   Diabetes Mother    Cancer Mother    Heart attack Father    Diabetes Sister     Social History   Tobacco Use    Smoking status: Former    Types: Cigarettes   Smokeless tobacco: Never   Tobacco comments:     " Quit smoking cigarettes in 2001"  Vaping Use   Vaping Use: Never used  Substance Use Topics   Alcohol use: Yes    Comment: social   Drug use: No    ROS   Objective:   Vitals: BP (!) 171/79 (BP Location: Left Arm)   Pulse (!) 108   Temp 98.4 F (36.9 C) (Oral)   Resp 16   SpO2 98%   Physical Exam Constitutional:      General: She is not in acute distress.    Appearance: Normal appearance. She is well-developed. She is not ill-appearing, toxic-appearing or diaphoretic.  HENT:     Head: Normocephalic and atraumatic.     Right Ear: Tympanic membrane, ear canal and external ear normal. No drainage or tenderness. No middle ear effusion. Tympanic membrane is not erythematous.     Left Ear: Tympanic membrane, ear canal and external ear normal. No drainage or tenderness.  No middle ear effusion. Tympanic membrane is not erythematous.     Nose: Nose normal. No congestion or rhinorrhea.     Mouth/Throat:     Mouth: Mucous membranes are moist. No oral lesions.     Pharynx: Oropharynx is clear. No pharyngeal swelling, oropharyngeal exudate, posterior oropharyngeal erythema or uvula swelling.     Tonsils: No tonsillar exudate or tonsillar abscesses.  Eyes:     General: No scleral icterus.       Right eye: No discharge.        Left eye: No discharge.     Extraocular Movements: Extraocular movements intact.     Right eye: Normal extraocular motion.     Left eye: Normal extraocular motion.     Conjunctiva/sclera: Conjunctivae normal.     Pupils: Pupils are equal, round, and reactive to light.  Cardiovascular:     Rate and Rhythm: Normal rate.  Pulmonary:     Effort: Pulmonary effort is normal.  Musculoskeletal:     Cervical back: Normal range of motion and neck supple.  Lymphadenopathy:     Cervical: No cervical adenopathy.  Skin:    General: Skin is warm and dry.     Findings:  Rash (multiple erythematous patches with associated excoriations over wrists, forearms and lower legs) present.  Neurological:     General: No focal deficit present.     Mental Status: She is alert and oriented to person, place, and time.     Motor: No weakness.     Coordination: Coordination normal.     Gait: Gait normal.     Deep Tendon Reflexes: Reflexes normal.  Psychiatric:        Mood and Affect: Mood normal.        Behavior: Behavior normal.        Thought Content: Thought content normal.  Judgment: Judgment normal.   Assessment and Plan :   PDMP not reviewed this encounter.  1. Rhus dermatitis     Recommended management with an oral prednisone course, hydroxyzine.  No other new exposures that patient can think of.  No people at home have a similar rash and therefore do not suspect bedbugs, scabies. Counseled patient on potential for adverse effects with medications prescribed/recommended today, ER and return-to-clinic precautions discussed, patient verbalized understanding.    Jaynee Eagles, PA-C 12/20/20 1534

## 2021-01-25 DIAGNOSIS — S92515A Nondisplaced fracture of proximal phalanx of left lesser toe(s), initial encounter for closed fracture: Secondary | ICD-10-CM | POA: Diagnosis not present

## 2021-01-25 DIAGNOSIS — M79672 Pain in left foot: Secondary | ICD-10-CM | POA: Diagnosis not present

## 2021-02-15 DIAGNOSIS — M79672 Pain in left foot: Secondary | ICD-10-CM | POA: Diagnosis not present

## 2021-02-15 DIAGNOSIS — S92515A Nondisplaced fracture of proximal phalanx of left lesser toe(s), initial encounter for closed fracture: Secondary | ICD-10-CM | POA: Diagnosis not present

## 2021-03-16 DIAGNOSIS — Z1231 Encounter for screening mammogram for malignant neoplasm of breast: Secondary | ICD-10-CM | POA: Diagnosis not present

## 2021-03-29 DIAGNOSIS — M79672 Pain in left foot: Secondary | ICD-10-CM | POA: Diagnosis not present

## 2021-03-29 DIAGNOSIS — S92515A Nondisplaced fracture of proximal phalanx of left lesser toe(s), initial encounter for closed fracture: Secondary | ICD-10-CM | POA: Diagnosis not present

## 2021-08-02 DIAGNOSIS — H2513 Age-related nuclear cataract, bilateral: Secondary | ICD-10-CM | POA: Diagnosis not present

## 2021-08-02 DIAGNOSIS — H43813 Vitreous degeneration, bilateral: Secondary | ICD-10-CM | POA: Diagnosis not present

## 2021-08-02 DIAGNOSIS — H40053 Ocular hypertension, bilateral: Secondary | ICD-10-CM | POA: Diagnosis not present

## 2021-08-02 DIAGNOSIS — H52223 Regular astigmatism, bilateral: Secondary | ICD-10-CM | POA: Diagnosis not present

## 2021-08-02 DIAGNOSIS — E119 Type 2 diabetes mellitus without complications: Secondary | ICD-10-CM | POA: Diagnosis not present

## 2021-08-25 DIAGNOSIS — I10 Essential (primary) hypertension: Secondary | ICD-10-CM | POA: Diagnosis not present

## 2021-08-25 DIAGNOSIS — E538 Deficiency of other specified B group vitamins: Secondary | ICD-10-CM | POA: Diagnosis not present

## 2021-08-25 DIAGNOSIS — Z0000 Encounter for general adult medical examination without abnormal findings: Secondary | ICD-10-CM | POA: Diagnosis not present

## 2021-08-25 DIAGNOSIS — E559 Vitamin D deficiency, unspecified: Secondary | ICD-10-CM | POA: Diagnosis not present

## 2021-08-25 DIAGNOSIS — E669 Obesity, unspecified: Secondary | ICD-10-CM | POA: Diagnosis not present

## 2021-08-25 DIAGNOSIS — E785 Hyperlipidemia, unspecified: Secondary | ICD-10-CM | POA: Diagnosis not present

## 2021-08-25 DIAGNOSIS — I700 Atherosclerosis of aorta: Secondary | ICD-10-CM | POA: Diagnosis not present

## 2021-08-25 DIAGNOSIS — E1169 Type 2 diabetes mellitus with other specified complication: Secondary | ICD-10-CM | POA: Diagnosis not present

## 2021-09-13 DIAGNOSIS — E785 Hyperlipidemia, unspecified: Secondary | ICD-10-CM | POA: Diagnosis not present

## 2021-09-13 DIAGNOSIS — I10 Essential (primary) hypertension: Secondary | ICD-10-CM | POA: Diagnosis not present

## 2021-09-13 DIAGNOSIS — E1169 Type 2 diabetes mellitus with other specified complication: Secondary | ICD-10-CM | POA: Diagnosis not present

## 2021-10-05 DIAGNOSIS — L814 Other melanin hyperpigmentation: Secondary | ICD-10-CM | POA: Diagnosis not present

## 2021-10-05 DIAGNOSIS — D2372 Other benign neoplasm of skin of left lower limb, including hip: Secondary | ICD-10-CM | POA: Diagnosis not present

## 2021-10-05 DIAGNOSIS — D225 Melanocytic nevi of trunk: Secondary | ICD-10-CM | POA: Diagnosis not present

## 2021-10-05 DIAGNOSIS — D235 Other benign neoplasm of skin of trunk: Secondary | ICD-10-CM | POA: Diagnosis not present

## 2021-10-05 DIAGNOSIS — L218 Other seborrheic dermatitis: Secondary | ICD-10-CM | POA: Diagnosis not present

## 2021-10-05 DIAGNOSIS — D1801 Hemangioma of skin and subcutaneous tissue: Secondary | ICD-10-CM | POA: Diagnosis not present

## 2022-03-30 DIAGNOSIS — Z1231 Encounter for screening mammogram for malignant neoplasm of breast: Secondary | ICD-10-CM | POA: Diagnosis not present

## 2022-05-04 DIAGNOSIS — I700 Atherosclerosis of aorta: Secondary | ICD-10-CM | POA: Diagnosis not present

## 2022-05-04 DIAGNOSIS — I10 Essential (primary) hypertension: Secondary | ICD-10-CM | POA: Diagnosis not present

## 2022-05-04 DIAGNOSIS — Z23 Encounter for immunization: Secondary | ICD-10-CM | POA: Diagnosis not present

## 2022-05-04 DIAGNOSIS — E785 Hyperlipidemia, unspecified: Secondary | ICD-10-CM | POA: Diagnosis not present

## 2022-05-04 DIAGNOSIS — E1169 Type 2 diabetes mellitus with other specified complication: Secondary | ICD-10-CM | POA: Diagnosis not present

## 2022-05-05 DIAGNOSIS — I10 Essential (primary) hypertension: Secondary | ICD-10-CM | POA: Diagnosis not present

## 2022-05-05 DIAGNOSIS — E785 Hyperlipidemia, unspecified: Secondary | ICD-10-CM | POA: Diagnosis not present

## 2022-05-05 DIAGNOSIS — E1169 Type 2 diabetes mellitus with other specified complication: Secondary | ICD-10-CM | POA: Diagnosis not present

## 2022-08-15 DIAGNOSIS — H40053 Ocular hypertension, bilateral: Secondary | ICD-10-CM | POA: Diagnosis not present

## 2022-08-15 DIAGNOSIS — H2513 Age-related nuclear cataract, bilateral: Secondary | ICD-10-CM | POA: Diagnosis not present

## 2022-08-15 DIAGNOSIS — H524 Presbyopia: Secondary | ICD-10-CM | POA: Diagnosis not present

## 2022-08-15 DIAGNOSIS — E119 Type 2 diabetes mellitus without complications: Secondary | ICD-10-CM | POA: Diagnosis not present

## 2022-08-15 DIAGNOSIS — H43813 Vitreous degeneration, bilateral: Secondary | ICD-10-CM | POA: Diagnosis not present

## 2022-08-15 DIAGNOSIS — H52223 Regular astigmatism, bilateral: Secondary | ICD-10-CM | POA: Diagnosis not present

## 2022-10-02 DIAGNOSIS — M1712 Unilateral primary osteoarthritis, left knee: Secondary | ICD-10-CM | POA: Diagnosis not present

## 2022-11-09 DIAGNOSIS — M1712 Unilateral primary osteoarthritis, left knee: Secondary | ICD-10-CM | POA: Diagnosis not present

## 2022-11-15 DIAGNOSIS — M1712 Unilateral primary osteoarthritis, left knee: Secondary | ICD-10-CM | POA: Diagnosis not present

## 2022-11-23 DIAGNOSIS — M1712 Unilateral primary osteoarthritis, left knee: Secondary | ICD-10-CM | POA: Diagnosis not present

## 2023-01-03 DIAGNOSIS — I700 Atherosclerosis of aorta: Secondary | ICD-10-CM | POA: Diagnosis not present

## 2023-01-03 DIAGNOSIS — M8588 Other specified disorders of bone density and structure, other site: Secondary | ICD-10-CM | POA: Diagnosis not present

## 2023-01-03 DIAGNOSIS — E1169 Type 2 diabetes mellitus with other specified complication: Secondary | ICD-10-CM | POA: Diagnosis not present

## 2023-01-03 DIAGNOSIS — E538 Deficiency of other specified B group vitamins: Secondary | ICD-10-CM | POA: Diagnosis not present

## 2023-01-03 DIAGNOSIS — K219 Gastro-esophageal reflux disease without esophagitis: Secondary | ICD-10-CM | POA: Diagnosis not present

## 2023-01-03 DIAGNOSIS — I10 Essential (primary) hypertension: Secondary | ICD-10-CM | POA: Diagnosis not present

## 2023-01-03 DIAGNOSIS — E559 Vitamin D deficiency, unspecified: Secondary | ICD-10-CM | POA: Diagnosis not present

## 2023-01-03 DIAGNOSIS — F324 Major depressive disorder, single episode, in partial remission: Secondary | ICD-10-CM | POA: Diagnosis not present

## 2023-01-03 DIAGNOSIS — Z0000 Encounter for general adult medical examination without abnormal findings: Secondary | ICD-10-CM | POA: Diagnosis not present

## 2023-01-03 DIAGNOSIS — E1165 Type 2 diabetes mellitus with hyperglycemia: Secondary | ICD-10-CM | POA: Diagnosis not present

## 2023-01-03 DIAGNOSIS — E785 Hyperlipidemia, unspecified: Secondary | ICD-10-CM | POA: Diagnosis not present

## 2023-08-27 DIAGNOSIS — E785 Hyperlipidemia, unspecified: Secondary | ICD-10-CM | POA: Diagnosis not present

## 2023-08-27 DIAGNOSIS — I700 Atherosclerosis of aorta: Secondary | ICD-10-CM | POA: Diagnosis not present

## 2023-08-27 DIAGNOSIS — E1169 Type 2 diabetes mellitus with other specified complication: Secondary | ICD-10-CM | POA: Diagnosis not present

## 2023-08-27 DIAGNOSIS — I10 Essential (primary) hypertension: Secondary | ICD-10-CM | POA: Diagnosis not present

## 2024-02-25 DIAGNOSIS — E1169 Type 2 diabetes mellitus with other specified complication: Secondary | ICD-10-CM | POA: Diagnosis not present

## 2024-02-25 DIAGNOSIS — M8588 Other specified disorders of bone density and structure, other site: Secondary | ICD-10-CM | POA: Diagnosis not present

## 2024-02-25 DIAGNOSIS — I10 Essential (primary) hypertension: Secondary | ICD-10-CM | POA: Diagnosis not present

## 2024-02-25 DIAGNOSIS — Z808 Family history of malignant neoplasm of other organs or systems: Secondary | ICD-10-CM | POA: Diagnosis not present

## 2024-02-25 DIAGNOSIS — E559 Vitamin D deficiency, unspecified: Secondary | ICD-10-CM | POA: Diagnosis not present

## 2024-02-25 DIAGNOSIS — Z0000 Encounter for general adult medical examination without abnormal findings: Secondary | ICD-10-CM | POA: Diagnosis not present

## 2024-02-25 DIAGNOSIS — E785 Hyperlipidemia, unspecified: Secondary | ICD-10-CM | POA: Diagnosis not present

## 2024-02-25 DIAGNOSIS — I700 Atherosclerosis of aorta: Secondary | ICD-10-CM | POA: Diagnosis not present

## 2024-02-25 DIAGNOSIS — E538 Deficiency of other specified B group vitamins: Secondary | ICD-10-CM | POA: Diagnosis not present

## 2024-02-25 DIAGNOSIS — K219 Gastro-esophageal reflux disease without esophagitis: Secondary | ICD-10-CM | POA: Diagnosis not present
